# Patient Record
Sex: Female | Born: 1985
Health system: Southern US, Community
[De-identification: ages and names within clinical notes are randomized; demographics above are authoritative.]

## PROBLEM LIST (undated history)

## (undated) DIAGNOSIS — T8859XA Other complications of anesthesia, initial encounter: Secondary | ICD-10-CM

## (undated) DIAGNOSIS — R51 Headache: Secondary | ICD-10-CM

## (undated) DIAGNOSIS — T4145XA Adverse effect of unspecified anesthetic, initial encounter: Secondary | ICD-10-CM

## (undated) DIAGNOSIS — Z789 Other specified health status: Secondary | ICD-10-CM

## (undated) HISTORY — PX: DENTAL SURGERY: SHX609

## (undated) HISTORY — PX: MANDIBLE SURGERY: SHX707

## (undated) HISTORY — PX: APPENDECTOMY: SHX54

## (undated) HISTORY — PX: FOOT SURGERY: SHX648

---

## 2010-03-18 ENCOUNTER — Ambulatory Visit (HOSPITAL_COMMUNITY): Admission: RE | Admit: 2010-03-18 | Discharge: 2010-03-18 | Payer: Self-pay | Admitting: Obstetrics & Gynecology

## 2010-04-08 ENCOUNTER — Ambulatory Visit (HOSPITAL_COMMUNITY): Admission: RE | Admit: 2010-04-08 | Discharge: 2010-04-08 | Payer: Self-pay | Admitting: Obstetrics & Gynecology

## 2010-06-30 ENCOUNTER — Ambulatory Visit (HOSPITAL_COMMUNITY): Admission: RE | Admit: 2010-06-30 | Discharge: 2010-06-30 | Payer: Self-pay | Admitting: Obstetrics & Gynecology

## 2010-07-09 ENCOUNTER — Ambulatory Visit (HOSPITAL_COMMUNITY): Admission: RE | Admit: 2010-07-09 | Discharge: 2010-07-09 | Payer: Self-pay | Admitting: Obstetrics & Gynecology

## 2010-12-21 ENCOUNTER — Encounter: Payer: Self-pay | Admitting: Obstetrics & Gynecology

## 2011-07-12 LAB — ANTIBODY SCREEN: Antibody Screen: NEGATIVE

## 2011-07-12 LAB — ABO/RH: RH Type: NEGATIVE

## 2011-07-12 LAB — HEPATITIS B SURFACE ANTIGEN: Hepatitis B Surface Ag: NEGATIVE

## 2011-07-12 LAB — RPR: RPR: NONREACTIVE

## 2011-07-27 ENCOUNTER — Other Ambulatory Visit: Payer: Self-pay

## 2011-07-29 ENCOUNTER — Other Ambulatory Visit (HOSPITAL_COMMUNITY): Payer: Self-pay | Admitting: Obstetrics & Gynecology

## 2011-07-29 DIAGNOSIS — O2690 Pregnancy related conditions, unspecified, unspecified trimester: Secondary | ICD-10-CM

## 2011-08-25 ENCOUNTER — Encounter (HOSPITAL_COMMUNITY): Payer: Self-pay

## 2011-08-25 ENCOUNTER — Ambulatory Visit (HOSPITAL_COMMUNITY)
Admission: RE | Admit: 2011-08-25 | Discharge: 2011-08-25 | Disposition: A | Discharge status: Home / Self Care | Payer: 59 | Source: Ambulatory Visit | Attending: Obstetrics & Gynecology | Admitting: Obstetrics & Gynecology

## 2011-08-25 ENCOUNTER — Other Ambulatory Visit (HOSPITAL_COMMUNITY): Payer: Self-pay | Admitting: Obstetrics & Gynecology

## 2011-08-25 DIAGNOSIS — O09299 Supervision of pregnancy with other poor reproductive or obstetric history, unspecified trimester: Secondary | ICD-10-CM | POA: Insufficient documentation

## 2011-08-25 DIAGNOSIS — O2690 Pregnancy related conditions, unspecified, unspecified trimester: Secondary | ICD-10-CM

## 2011-08-25 DIAGNOSIS — O34219 Maternal care for unspecified type scar from previous cesarean delivery: Secondary | ICD-10-CM | POA: Insufficient documentation

## 2011-08-25 DIAGNOSIS — O352XX0 Maternal care for (suspected) hereditary disease in fetus, not applicable or unspecified: Secondary | ICD-10-CM | POA: Insufficient documentation

## 2011-08-25 DIAGNOSIS — Z363 Encounter for antenatal screening for malformations: Secondary | ICD-10-CM | POA: Insufficient documentation

## 2011-08-25 DIAGNOSIS — O358XX0 Maternal care for other (suspected) fetal abnormality and damage, not applicable or unspecified: Secondary | ICD-10-CM | POA: Insufficient documentation

## 2011-08-25 DIAGNOSIS — Z1389 Encounter for screening for other disorder: Secondary | ICD-10-CM | POA: Insufficient documentation

## 2011-09-14 ENCOUNTER — Other Ambulatory Visit: Payer: Self-pay

## 2011-09-21 ENCOUNTER — Ambulatory Visit (HOSPITAL_COMMUNITY)
Admission: RE | Admit: 2011-09-21 | Discharge: 2011-09-21 | Disposition: A | Discharge status: Home / Self Care | Payer: 59 | Source: Ambulatory Visit | Attending: Obstetrics & Gynecology | Admitting: Obstetrics & Gynecology

## 2011-09-21 DIAGNOSIS — O34219 Maternal care for unspecified type scar from previous cesarean delivery: Secondary | ICD-10-CM | POA: Insufficient documentation

## 2011-09-21 DIAGNOSIS — O2690 Pregnancy related conditions, unspecified, unspecified trimester: Secondary | ICD-10-CM

## 2011-09-21 DIAGNOSIS — O09299 Supervision of pregnancy with other poor reproductive or obstetric history, unspecified trimester: Secondary | ICD-10-CM | POA: Insufficient documentation

## 2011-09-21 DIAGNOSIS — O352XX0 Maternal care for (suspected) hereditary disease in fetus, not applicable or unspecified: Secondary | ICD-10-CM | POA: Insufficient documentation

## 2011-12-01 NOTE — L&D Delivery Note (Signed)
Delivery Note At 9:22 PM a viable and healthy female was delivered via VBAC, Spontaneous (Presentation: Left Occiput Anterior).  APGAR: 8, 9; weight 8 lb 6 oz (3799 g).   Placenta status: Intact, Spontaneous. Not sent Cord: 3 vessels with the following complications: None.  Cord pH: none  Anesthesia: Epidural  Episiotomy: None Lacerations: Sulcus;Vaginal Suture Repair: 3.0 chromic Est. Blood Loss (mL): 300  Mom to postpartum.  Baby to nursery-stable.  Maebell Lyvers A 02/08/2012, 10:03 PM

## 2012-01-25 LAB — STREP B DNA PROBE: GBS: NEGATIVE

## 2012-02-07 ENCOUNTER — Inpatient Hospital Stay (HOSPITAL_COMMUNITY)
Admission: AD | Admit: 2012-02-07 | Discharge: 2012-02-08 | Disposition: A | Discharge status: Home / Self Care | Payer: 59 | Source: Ambulatory Visit | Attending: Obstetrics and Gynecology | Admitting: Obstetrics and Gynecology

## 2012-02-07 ENCOUNTER — Encounter (HOSPITAL_COMMUNITY): Payer: Self-pay | Admitting: *Deleted

## 2012-02-07 DIAGNOSIS — O479 False labor, unspecified: Secondary | ICD-10-CM | POA: Insufficient documentation

## 2012-02-07 HISTORY — DX: Other specified health status: Z78.9

## 2012-02-07 NOTE — Progress Notes (Signed)
Dr. Billy Coast notified of pt presenting for labor check.  Notified of previous c/s and planning on VBAC.  Notified of VE and ctx pattern with reactive nst.  Orders to monitor for 1 hour and recheck cervix.  If no change may dc home with labor precautions.

## 2012-02-07 NOTE — Progress Notes (Signed)
Pt G2 P1, previous C/S, baby died due to a heart defect.  Having contractions every 4-6 min, passing mucous plug.  Plans for VBAC.

## 2012-02-08 ENCOUNTER — Inpatient Hospital Stay (HOSPITAL_COMMUNITY)
Admission: AD | Admit: 2012-02-08 | Discharge: 2012-02-10 | DRG: 775 | Disposition: A | Discharge status: Home / Self Care | Payer: 59 | Source: Ambulatory Visit | Attending: Obstetrics and Gynecology | Admitting: Obstetrics and Gynecology

## 2012-02-08 ENCOUNTER — Encounter (HOSPITAL_COMMUNITY): Payer: Self-pay | Admitting: *Deleted

## 2012-02-08 ENCOUNTER — Encounter (HOSPITAL_COMMUNITY): Payer: Self-pay | Admitting: Anesthesiology

## 2012-02-08 ENCOUNTER — Inpatient Hospital Stay (HOSPITAL_COMMUNITY): Payer: 59 | Admitting: Anesthesiology

## 2012-02-08 DIAGNOSIS — Z22330 Carrier of Group B streptococcus: Secondary | ICD-10-CM

## 2012-02-08 DIAGNOSIS — O480 Post-term pregnancy: Principal | ICD-10-CM | POA: Diagnosis present

## 2012-02-08 DIAGNOSIS — O34219 Maternal care for unspecified type scar from previous cesarean delivery: Secondary | ICD-10-CM | POA: Diagnosis present

## 2012-02-08 DIAGNOSIS — O99892 Other specified diseases and conditions complicating childbirth: Secondary | ICD-10-CM | POA: Diagnosis present

## 2012-02-08 DIAGNOSIS — IMO0001 Reserved for inherently not codable concepts without codable children: Secondary | ICD-10-CM

## 2012-02-08 LAB — CBC
HCT: 36.1 % (ref 36.0–46.0)
MCH: 32.6 pg (ref 26.0–34.0)
MCHC: 34.1 g/dL (ref 30.0–36.0)
MCV: 95.8 fL (ref 78.0–100.0)
Platelets: 177 10*3/uL (ref 150–400)
RDW: 14.9 % (ref 11.5–15.5)

## 2012-02-08 LAB — ABO/RH: ABO/RH(D): O NEG

## 2012-02-08 MED ORDER — OXYCODONE-ACETAMINOPHEN 5-325 MG PO TABS: 1.0000 | ORAL_TABLET | ORAL | Status: DC | PRN

## 2012-02-08 MED ORDER — SENNOSIDES-DOCUSATE SODIUM 8.6-50 MG PO TABS
2.0000 | ORAL_TABLET | Freq: Every day | ORAL | Status: DC
  Administered 2012-02-09: 2 via ORAL

## 2012-02-08 MED ORDER — LACTATED RINGERS IV SOLN: INTRAVENOUS | Status: DC

## 2012-02-08 MED ORDER — TERBUTALINE SULFATE 1 MG/ML IJ SOLN: 0.2500 mg | Freq: Once | INTRAMUSCULAR | Status: DC | PRN

## 2012-02-08 MED ORDER — OXYTOCIN 20 UNITS IN LACTATED RINGERS INFUSION - SIMPLE: 1.0000 m[IU]/min | INTRAVENOUS | Status: DC

## 2012-02-08 MED ORDER — PHENYLEPHRINE 40 MCG/ML (10ML) SYRINGE FOR IV PUSH (FOR BLOOD PRESSURE SUPPORT): 80.0000 ug | PREFILLED_SYRINGE | INTRAVENOUS | Status: DC | PRN

## 2012-02-08 MED ORDER — WITCH HAZEL-GLYCERIN EX PADS: 1.0000 "application " | MEDICATED_PAD | CUTANEOUS | Status: DC | PRN

## 2012-02-08 MED ORDER — EPHEDRINE 5 MG/ML INJ
10.0000 mg | INTRAVENOUS | Status: DC | PRN
  Filled 2012-02-08: qty 4

## 2012-02-08 MED ORDER — TETANUS-DIPHTH-ACELL PERTUSSIS 5-2.5-18.5 LF-MCG/0.5 IM SUSP
0.5000 mL | Freq: Once | INTRAMUSCULAR | Status: AC
  Administered 2012-02-09: 0.5 mL via INTRAMUSCULAR
  Filled 2012-02-08: qty 0.5

## 2012-02-08 MED ORDER — IBUPROFEN 600 MG PO TABS
600.0000 mg | ORAL_TABLET | Freq: Four times a day (QID) | ORAL | Status: DC
  Administered 2012-02-09 – 2012-02-10 (×5): 600 mg via ORAL
  Filled 2012-02-08 (×4): qty 1

## 2012-02-08 MED ORDER — SIMETHICONE 80 MG PO CHEW: 80.0000 mg | CHEWABLE_TABLET | ORAL | Status: DC | PRN

## 2012-02-08 MED ORDER — FERROUS SULFATE 325 (65 FE) MG PO TABS
325.0000 mg | ORAL_TABLET | Freq: Two times a day (BID) | ORAL | Status: DC
  Administered 2012-02-09 – 2012-02-10 (×3): 325 mg via ORAL
  Filled 2012-02-08 (×3): qty 1

## 2012-02-08 MED ORDER — LACTATED RINGERS IV SOLN: 500.0000 mL | INTRAVENOUS | Status: DC | PRN

## 2012-02-08 MED ORDER — DIPHENHYDRAMINE HCL 50 MG/ML IJ SOLN: 12.5000 mg | INTRAMUSCULAR | Status: DC | PRN

## 2012-02-08 MED ORDER — CITRIC ACID-SODIUM CITRATE 334-500 MG/5ML PO SOLN: 30.0000 mL | ORAL | Status: DC | PRN

## 2012-02-08 MED ORDER — OXYTOCIN BOLUS FROM INFUSION
500.0000 mL | Freq: Once | INTRAVENOUS | Status: DC
  Filled 2012-02-08: qty 500
  Filled 2012-02-08: qty 1000

## 2012-02-08 MED ORDER — ZOLPIDEM TARTRATE 5 MG PO TABS: 5.0000 mg | ORAL_TABLET | Freq: Every evening | ORAL | Status: DC | PRN

## 2012-02-08 MED ORDER — IBUPROFEN 600 MG PO TABS
600.0000 mg | ORAL_TABLET | Freq: Four times a day (QID) | ORAL | Status: DC | PRN
  Administered 2012-02-08: 600 mg via ORAL
  Filled 2012-02-08: qty 1

## 2012-02-08 MED ORDER — LIDOCAINE HCL (PF) 1 % IJ SOLN
INTRAMUSCULAR | Status: DC | PRN
  Administered 2012-02-08 (×2): 5 mL

## 2012-02-08 MED ORDER — DIBUCAINE 1 % RE OINT: 1.0000 "application " | TOPICAL_OINTMENT | RECTAL | Status: DC | PRN

## 2012-02-08 MED ORDER — ONDANSETRON HCL 4 MG/2ML IJ SOLN: 4.0000 mg | Freq: Four times a day (QID) | INTRAMUSCULAR | Status: DC | PRN

## 2012-02-08 MED ORDER — LIDOCAINE HCL (PF) 1 % IJ SOLN
30.0000 mL | INTRAMUSCULAR | Status: DC | PRN
  Filled 2012-02-08: qty 30

## 2012-02-08 MED ORDER — LANOLIN HYDROUS EX OINT: TOPICAL_OINTMENT | CUTANEOUS | Status: DC | PRN

## 2012-02-08 MED ORDER — EPHEDRINE 5 MG/ML INJ: 10.0000 mg | INTRAVENOUS | Status: DC | PRN

## 2012-02-08 MED ORDER — ONDANSETRON HCL 4 MG PO TABS: 4.0000 mg | ORAL_TABLET | ORAL | Status: DC | PRN

## 2012-02-08 MED ORDER — PRENATAL MULTIVITAMIN CH
1.0000 | ORAL_TABLET | Freq: Every day | ORAL | Status: DC
  Administered 2012-02-09 – 2012-02-10 (×2): 1 via ORAL
  Filled 2012-02-08 (×2): qty 1

## 2012-02-08 MED ORDER — PHENYLEPHRINE 40 MCG/ML (10ML) SYRINGE FOR IV PUSH (FOR BLOOD PRESSURE SUPPORT)
80.0000 ug | PREFILLED_SYRINGE | INTRAVENOUS | Status: DC | PRN
  Filled 2012-02-08: qty 5

## 2012-02-08 MED ORDER — FENTANYL 2.5 MCG/ML BUPIVACAINE 1/10 % EPIDURAL INFUSION (WH - ANES)
14.0000 mL/h | INTRAMUSCULAR | Status: DC
  Administered 2012-02-08 (×2): 14 mL/h via EPIDURAL
  Filled 2012-02-08 (×2): qty 60

## 2012-02-08 MED ORDER — FLEET ENEMA 7-19 GM/118ML RE ENEM: 1.0000 | ENEMA | RECTAL | Status: DC | PRN

## 2012-02-08 MED ORDER — LACTATED RINGERS IV SOLN: 500.0000 mL | Freq: Once | INTRAVENOUS | Status: DC

## 2012-02-08 MED ORDER — ACETAMINOPHEN 325 MG PO TABS: 650.0000 mg | ORAL_TABLET | ORAL | Status: DC | PRN

## 2012-02-08 MED ORDER — DIPHENHYDRAMINE HCL 25 MG PO CAPS: 25.0000 mg | ORAL_CAPSULE | Freq: Four times a day (QID) | ORAL | Status: DC | PRN

## 2012-02-08 MED ORDER — BUPIVACAINE HCL (PF) 0.25 % IJ SOLN
INTRAMUSCULAR | Status: DC | PRN
  Administered 2012-02-08 (×2): 5 mL

## 2012-02-08 MED ORDER — OXYTOCIN 20 UNITS IN LACTATED RINGERS INFUSION - SIMPLE
125.0000 mL/h | Freq: Once | INTRAVENOUS | Status: AC
  Administered 2012-02-08: 500 mL/h via INTRAVENOUS

## 2012-02-08 MED ORDER — BENZOCAINE-MENTHOL 20-0.5 % EX AERO: 1.0000 "application " | INHALATION_SPRAY | CUTANEOUS | Status: DC | PRN

## 2012-02-08 MED ORDER — ONDANSETRON HCL 4 MG/2ML IJ SOLN: 4.0000 mg | INTRAMUSCULAR | Status: DC | PRN

## 2012-02-08 NOTE — Progress Notes (Signed)
Holly Boyd is a 26 y.o. G2P1000 at [redacted]w[redacted]d by LMP admitted for active labor. Comfortable post epidural  Subjective: No chief complaint on file.   Objective: BP 123/83  Pulse 92  Temp(Src) 98.2 F (36.8 C) (Oral)  Resp 20  SpO2 98%  LMP 05/03/2011      FHT:  FHR: 140 bpm, variability: moderate,  accelerations:  Present,  decelerations:  Absent UC:   irregular, every 1 1/2-3 minutes SVE:   5/80/-1 Arom clear fluid  IUPC Placed asynclytic  Labs: Lab Results  Component Value Date   WBC 16.4* 02/08/2012   HGB 12.3 02/08/2012   HCT 36.1 02/08/2012   MCV 95.8 02/08/2012   PLT 177 02/08/2012    Assessment / Plan: active labor Prev c/s Postdates P) pitocin augmentation prn.   exaggerated left sims   Anticipated MOD:  unsure  Richie Bonanno A 02/08/2012, 6:05 PM

## 2012-02-08 NOTE — Progress Notes (Signed)
Pt transported to room 114 via wheelchair at 2300, support person present, report given to mb nurse

## 2012-02-08 NOTE — H&P (Signed)
Holly Boyd is a 26 y.o. female G2P1100 MWF hx LTCS now @ 40 4/[redacted] wk gestation present in active labor. Pt desires attempted vaginal birth. PNC uncomplicated. Prev neonatal death due to cardiac anomalies. Exam in office 4/80/-1 bulging membrane History OB History    Grav Para Term Preterm Abortions TAB SAB Ect Mult Living   2 1 1  0 0 0 0 0 0 0     Past Medical History  Diagnosis Date  . No pertinent past medical history    Past Surgical History  Procedure Date  . Cesarean section   . Appendectomy   . Foot surgery   . Dental surgery   . Mandible surgery    Family History: family history is not on file. Social History:  reports that she has never smoked. She does not have any smokeless tobacco history on file. She reports that she does not drink alcohol or use illicit drugs.  ROS neg    Blood pressure 135/88, pulse 96, last menstrual period 05/03/2011. Maternal Exam:  Uterine Assessment: Contraction strength is moderate.  Contraction frequency is irregular.   Abdomen: Surgical scars: low transverse.   Fundal height is term.   Estimated fetal weight is 8 1/2 lb.   Fetal presentation: vertex  Introitus: Ferning test: not done.  Nitrazine test: not done. Amniotic fluid character: not assessed.  Cervix: Cervix evaluated by digital exam.     Physical Exam  Constitutional: She is oriented to person, place, and time. She appears well-developed and well-nourished.       obese  HENT:  Head: Normocephalic.  Eyes: EOM are normal.  Neck: Neck supple.  Cardiovascular: Regular rhythm.   Respiratory: Breath sounds normal.  GI: Soft.       gravid  Musculoskeletal: She exhibits no edema.  Neurological: She is alert and oriented to person, place, and time.  Skin: Skin is warm and dry.  Psychiatric: She has a normal mood and affect.   Tracing; baseline 140   Ctx q 1 1/2 to 3 mins Prenatal labs: ABO, Rh:  O neg Antibody:  neg Rubella:  immune RPR:   nr HBsAg:   neg HIV:    neg GBS:   neg   Assessment/Plan: Active labor Previous C/S Postdates  P) admit routine labs. Reconfirm desire for AVBAC( pt and husband expresses desire). Epidural. Low dose pitocin amniotomy prn. Rhophylac if indicated  Holly Boyd A 02/08/2012, 4:25 PM

## 2012-02-08 NOTE — Anesthesia Preprocedure Evaluation (Signed)
Anesthesia Evaluation  Patient identified by MRN, date of birth, ID band Patient awake    Reviewed: Allergy & Precautions, H&P , Patient's Chart, lab work & pertinent test results  Airway Mallampati: II TM Distance: >3 FB Neck ROM: full    Dental No notable dental hx.    Pulmonary neg pulmonary ROS,  breath sounds clear to auscultation  Pulmonary exam normal       Cardiovascular negative cardio ROS  Rhythm:regular Rate:Normal     Neuro/Psych negative neurological ROS  negative psych ROS   GI/Hepatic negative GI ROS, Neg liver ROS,   Endo/Other  negative endocrine ROSMorbid obesity  Renal/GU negative Renal ROS     Musculoskeletal   Abdominal   Peds  Hematology negative hematology ROS (+)   Anesthesia Other Findings   Reproductive/Obstetrics (+) Pregnancy                           Anesthesia Physical Anesthesia Plan  ASA: III  Anesthesia Plan: Epidural   Post-op Pain Management:    Induction:   Airway Management Planned:   Additional Equipment:   Intra-op Plan:   Post-operative Plan:   Informed Consent: I have reviewed the patients History and Physical, chart, labs and discussed the procedure including the risks, benefits and alternatives for the proposed anesthesia with the patient or authorized representative who has indicated his/her understanding and acceptance.     Plan Discussed with:   Anesthesia Plan Comments:         Anesthesia Quick Evaluation  

## 2012-02-08 NOTE — ED Provider Notes (Signed)
History   Presents for Labor evaluation  Chief Complaint  Patient presents with  . Labor Eval   HPI  OB History    Grav Para Term Preterm Abortions TAB SAB Ect Mult Living   2 1 1  0 0 0 0 0 0 0      Past Medical History  Diagnosis Date  . No pertinent past medical history     Past Surgical History  Procedure Date  . Cesarean section   . Appendectomy   . Foot surgery   . Dental surgery   . Mandible surgery     History reviewed. No pertinent family history.  History  Substance Use Topics  . Smoking status: Never Smoker   . Smokeless tobacco: Not on file  . Alcohol Use: No    Allergies:  Allergies  Allergen Reactions  . Morphine And Related Hives and Itching  . Penicillins Hives and Itching    No prescriptions prior to admission    ROS Physical Exam NST review   Blood pressure 118/73, pulse 103, temperature 97.9 F (36.6 C), temperature source Oral, resp. rate 20, height 5\' 3"  (1.6 m), weight 111.222 kg (245 lb 3.2 oz), last menstrual period 05/03/2011.  Physical Exam  MAU Course  Procedures  MDM na  Assessment and Plan  Prodromal labor Reactive NsT DC home Labor warnings  Rameen Gohlke J 02/08/2012, 9:20 AM

## 2012-02-08 NOTE — Anesthesia Procedure Notes (Signed)
Epidural Patient location during procedure: OB Start time: 02/08/2012 4:42 PM  Staffing Anesthesiologist: Brayton Caves R Performed by: anesthesiologist   Preanesthetic Checklist Completed: patient identified, site marked, surgical consent, pre-op evaluation, timeout performed, IV checked, risks and benefits discussed and monitors and equipment checked  Epidural Patient position: sitting Prep: site prepped and draped and DuraPrep Patient monitoring: continuous pulse ox and blood pressure Approach: midline Injection technique: LOR air and LOR saline  Needle:  Needle type: Tuohy  Needle gauge: 17 G Needle length: 9 cm Needle insertion depth: 7 cm Catheter type: closed end flexible Catheter size: 19 Gauge Catheter at skin depth: 12 cm Test dose: negative  Assessment Events: blood not aspirated, injection not painful, no injection resistance, negative IV test and no paresthesia  Additional Notes Patient identified.  Risk benefits discussed including failed block, incomplete pain control, headache, nerve damage, paralysis, blood pressure changes, nausea, vomiting, reactions to medication both toxic or allergic, and postpartum back pain.  Patient expressed understanding and wished to proceed.  All questions were answered.  Sterile technique used throughout procedure and epidural site dressed with sterile barrier dressing. No paresthesia or other complications noted.The patient did not experience any signs of intravascular injection such as tinnitus or metallic taste in mouth nor signs of intrathecal spread such as rapid motor block. Please see nursing notes for vital signs.

## 2012-02-09 ENCOUNTER — Encounter (HOSPITAL_COMMUNITY): Payer: Self-pay

## 2012-02-09 LAB — CBC
HCT: 33 % — ABNORMAL LOW (ref 36.0–46.0)
Hemoglobin: 11 g/dL — ABNORMAL LOW (ref 12.0–15.0)
RBC: 3.4 MIL/uL — ABNORMAL LOW (ref 3.87–5.11)

## 2012-02-09 MED ORDER — BENZOCAINE-MENTHOL 20-0.5 % EX AERO
INHALATION_SPRAY | CUTANEOUS | Status: AC
  Filled 2012-02-09: qty 56

## 2012-02-09 NOTE — Progress Notes (Signed)
PPD 1 VBAC  S:  Reports feeling well.             Tolerating po/ No nausea or vomiting             Bleeding is moderate             Pain controlled with Motrin             Up ad lib / ambulatory  Newborn  Information for the patient's newborn:  Asleson, Girl Noriah [[id number]]  female  breast feeding  / difficulty latching, "pinched" nipple; assisted w/ BFing and latch   O:  A & O x 3 NAD             VS: Blood pressure 127/82, pulse 98, temperature 98.3 F (36.8 C), temperature source Oral, resp. rate 20, weight 111.131 kg (245 lb), last menstrual period 05/03/2011, SpO2 97.00%, unknown if currently breastfeeding.  LABS:  Basename 02/09/12 0537 02/08/12 1550  WBC 19.4* 16.4*  HGB 11.0* 12.3  HCT 33.0* 36.1  PLT 188 177    I&O:        Lungs: Clear and unlabored  Heart: regular rate and rhythm / no mumurs  Abdomen: soft, non-tender, non-distended, obese             Fundus: firm, non-tender, @U   Perineum: intact, no edema  Lochia: small  Extremities: no edema, no calf pain or tenderness, neg Homans    A/P: PPD # 1 26 y.o., G2P2001    Principal Problem:  *PP care - s/p VBAC 3/11   Doing well - stable status  Routine post partum orders  Lactation assist today for latch  Anticipate discharge home in AM.   Westin Knotts, CNM, MSN 02/09/2012, 10:06 AM

## 2012-02-09 NOTE — Anesthesia Postprocedure Evaluation (Signed)
  Anesthesia Post-op Note  Patient: Holly Boyd  Procedure(s) Performed: * No procedures listed *  Patient Location: Mother/Baby  Anesthesia Type: Epidural  Level of Consciousness: awake  Airway and Oxygen Therapy: Patient Spontanous Breathing  Post-op Pain: none  Post-op Assessment: Patient's Cardiovascular Status Stable, Respiratory Function Stable and No signs of Nausea or vomiting  Post-op Vital Signs: Reviewed and stable  Complications: No apparent anesthesia complications

## 2012-02-10 MED ORDER — IBUPROFEN 600 MG PO TABS: 600.0000 mg | ORAL_TABLET | Freq: Four times a day (QID) | ORAL | Status: AC

## 2012-02-10 NOTE — Discharge Summary (Signed)
Obstetric Discharge Summary Reason for Admission: onset of labor Prenatal Procedures: none Intrapartum Procedures: spontaneous vaginal delivery and successful VBAC Postpartum Procedures: none Complications-Operative and Postpartum: vaginal laceration Hemoglobin  Date Value Range Status  02/09/2012 11.0* 12.0-15.0 (g/dL) Final     HCT  Date Value Range Status  02/09/2012 33.0* 36.0-46.0 (%) Final    Discharge Diagnoses: Term Pregnancy-delivered  Discharge Information: Date: 02/10/2012 Activity: pelvic rest Diet: routine Medications: PNV, Ibuprofen and Calcium 500-1000mg  with magnesium 400 mg (leg cramps) Condition: stable Instructions: refer to practice specific booklet Discharge to: home Follow-up Information    Follow up with LAVOIE,MARIE-LYNE, MD. Schedule an appointment as soon as possible for a visit in 6 weeks.   Contact information:   [address] [place] [address] [phone]          Newborn Data: Live born female  Birth Weight: 8 lb 6 oz (3799 g) APGAR: 8, 9  Home with mother.  Marlinda Mike 02/10/2012, 10:57 AM

## 2012-02-10 NOTE — Discharge Instructions (Signed)
Leg cramps: calcium 500-1000mg  ( TUMS 1-2 tabs)  AND Magnesium 400 mg nightly x 1 week  Push water intake - limit salt Call if swelling is not resolved in 1 week

## 2012-02-10 NOTE — Progress Notes (Signed)
Patient ID: Learta Codding, female   DOB: 1986-08-19, 26 y.o.   MRN: [id number]  PPD 2 SVD  S:  Reports feeling well - ready for discharge             Tolerating po/ No nausea or vomiting             Bleeding is light             Pain controlled with motrin only             Up ad lib / ambulatory  Newborn breast feeding     O:  A & O x 3              VS: Blood pressure 102/69, pulse 86, temperature 98.2 F (36.8 C), temperature source Oral, resp. rate 18, weight 111.131 kg (245 lb), last menstrual period 05/03/2011, SpO2 97.00%, unknown if currently breastfeeding.  Lungs: Clear and unlabored  Heart: regular rate and rhythm / no mumurs  Abdomen: soft, non-tender, non-distended              Fundus: firm, non-tender, U-1  Perineum: mild edema  Lochia: light  Extremities: 1+ edema, no calf pain or tenderness    A: PPD # 2 SVD             Dependent edema  Doing well - stable status  P:  Routine post partum orders  Discharge home  Marlinda Mike 02/10/2012, 10:53 AM

## 2012-02-22 IMAGING — US US OB DETAIL+14 WK
1 series · 18 of 28 positions shown · non-contrast
Comparison: none

OBSTETRICAL ULTRASOUND:
 This ultrasound was performed in The [HOSPITAL], and the [facility] report will be stored to [REDACTED] PACS.  This report is also available in [HOSPITAL]?s accessANYware.

[Series 1: us ob detail+14 wk · 18 of 47 slices shown]
[im 1/47]
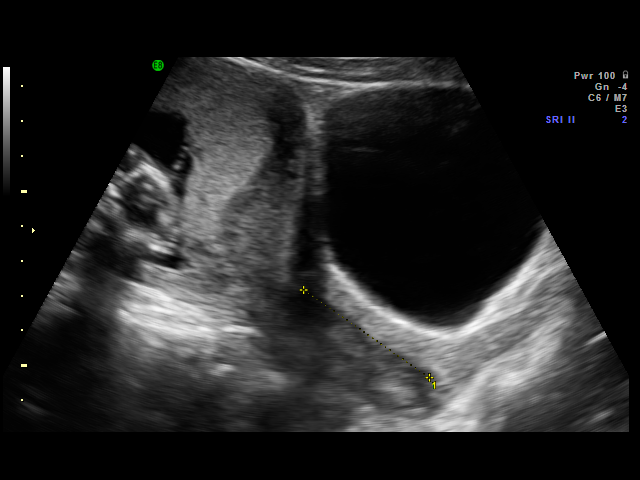
[im 4/47]
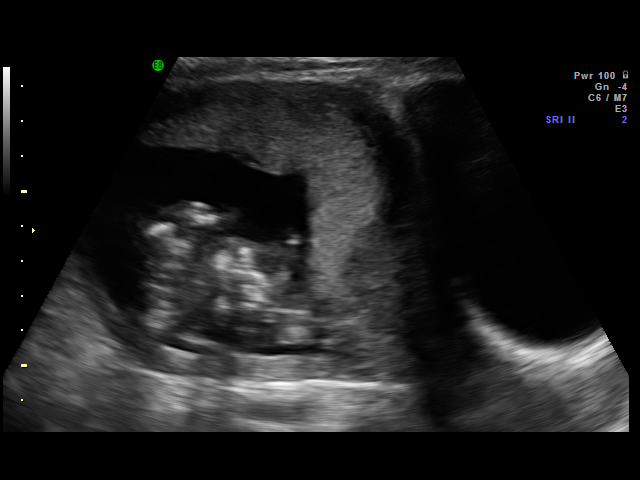
[im 6/47]
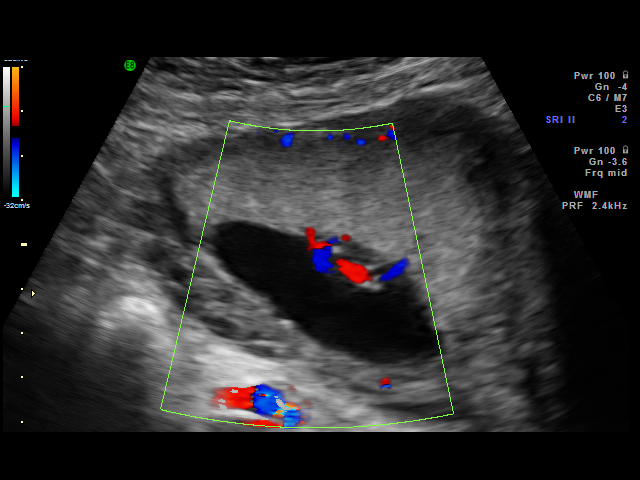
[im 9/47]
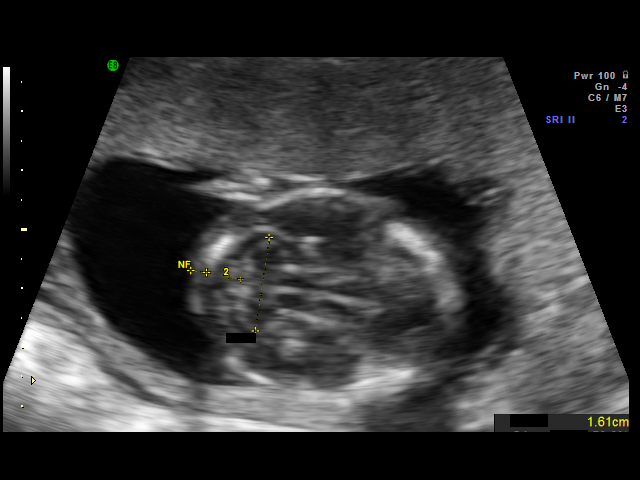
[im 12/47]
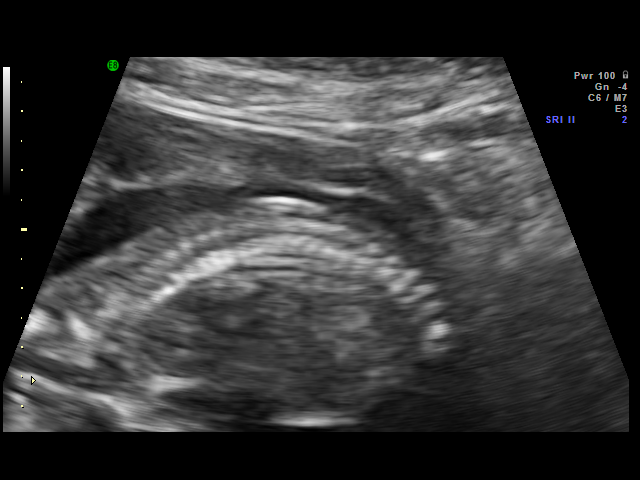
[im 14/47]
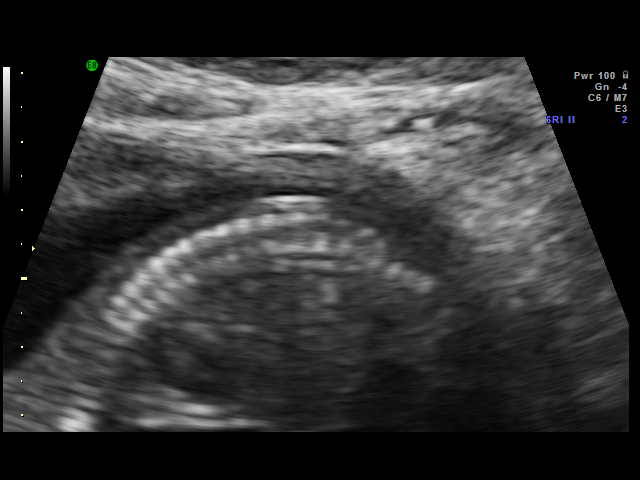
[im 18/47]
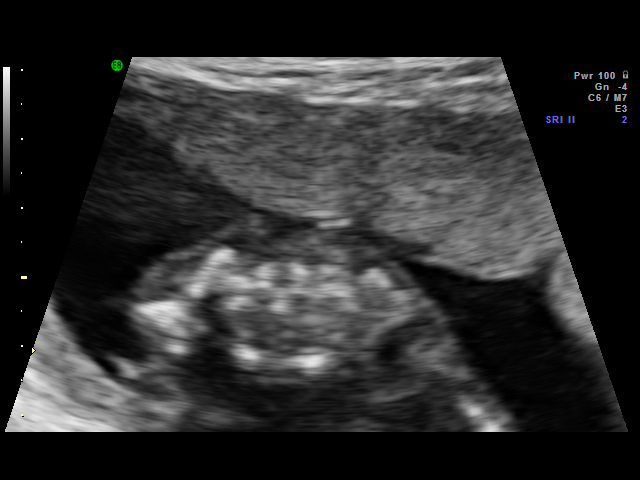
[im 19/47]
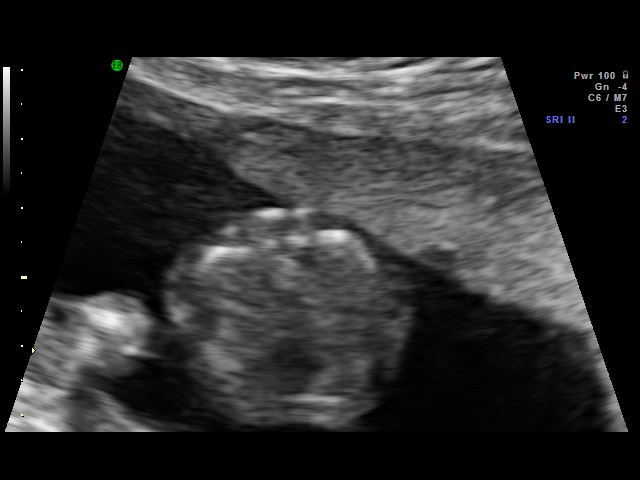
[im 23/47]
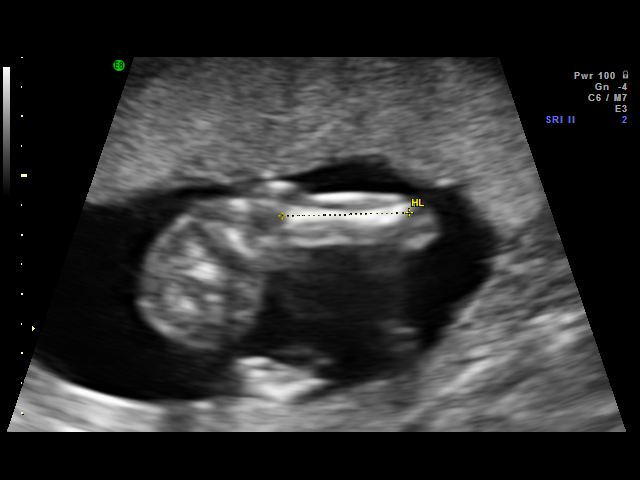
[im 24/47]
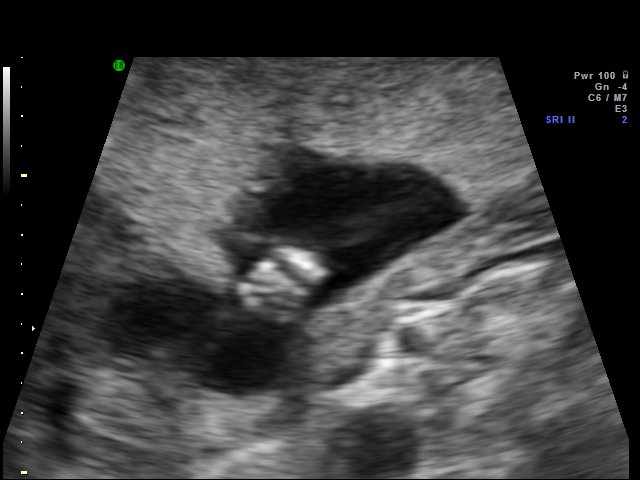
[im 28/47]
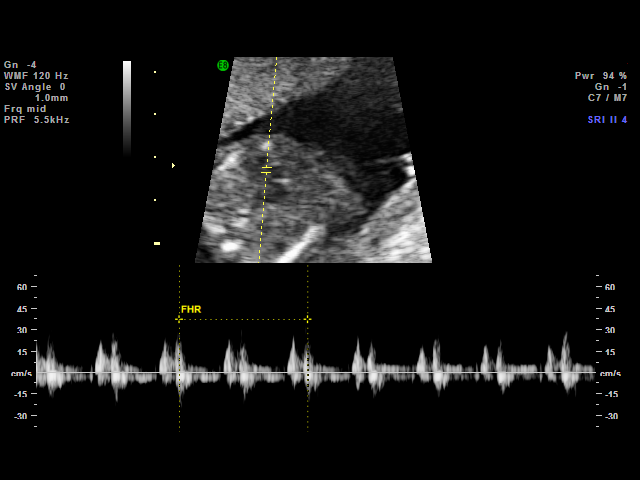
[im 29/47]
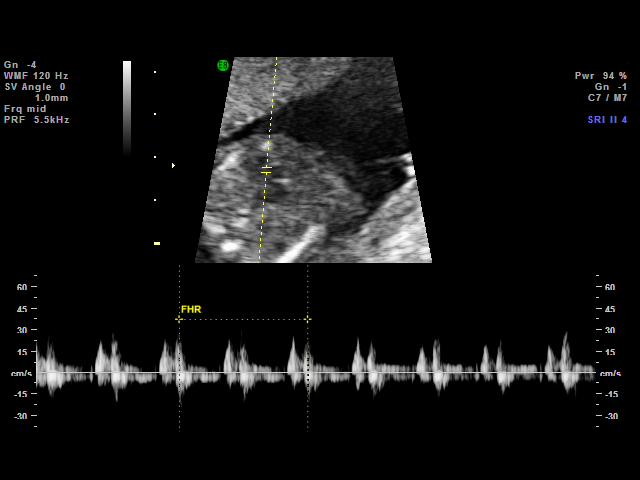
[im 33/47]
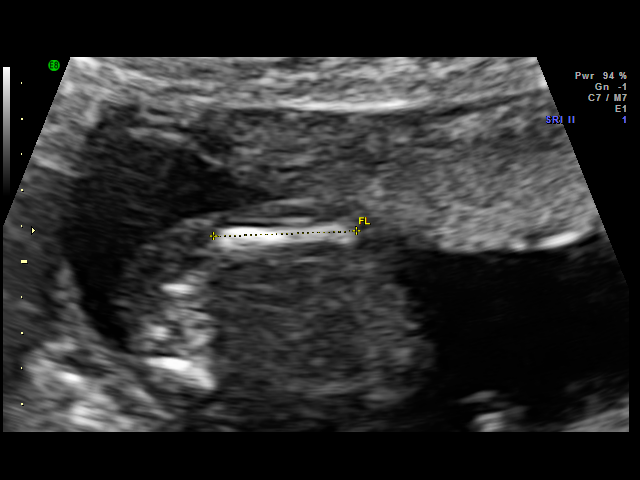
[im 36/47]
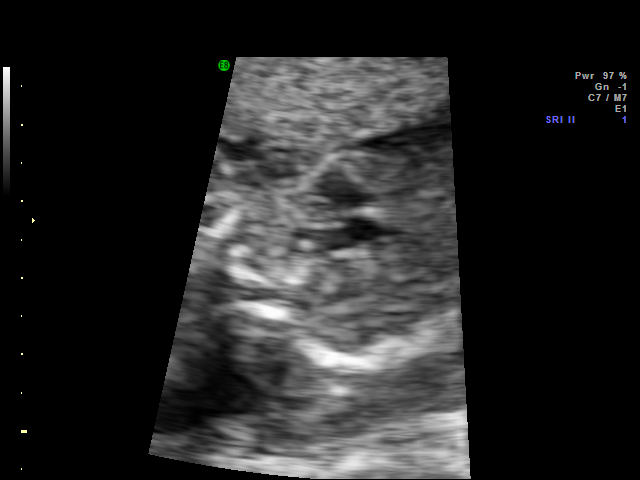
[im 38/47]
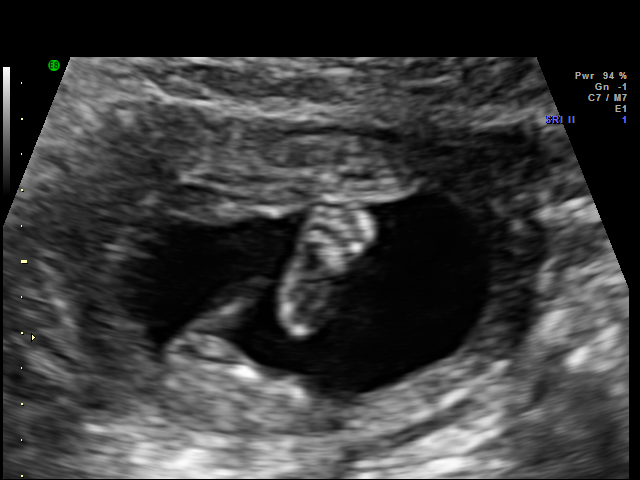
[im 41/47]
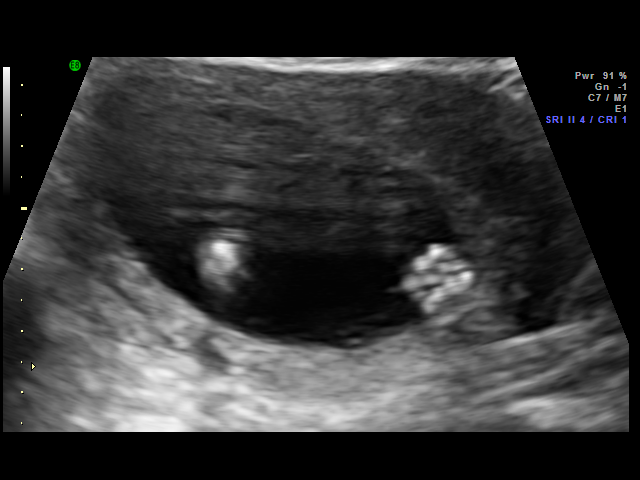
[im 43/47]
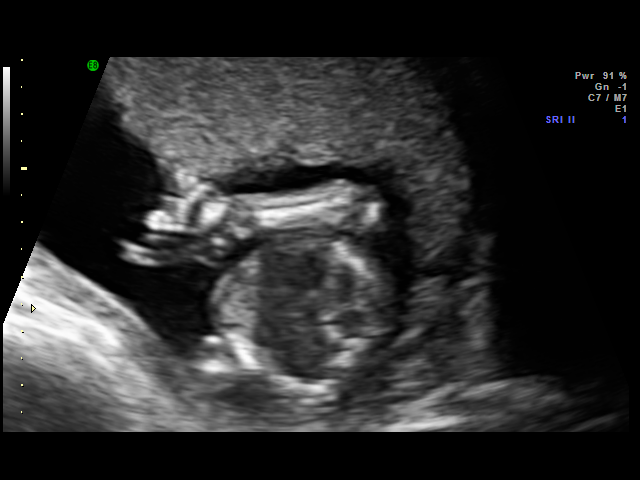
[im 47/47]
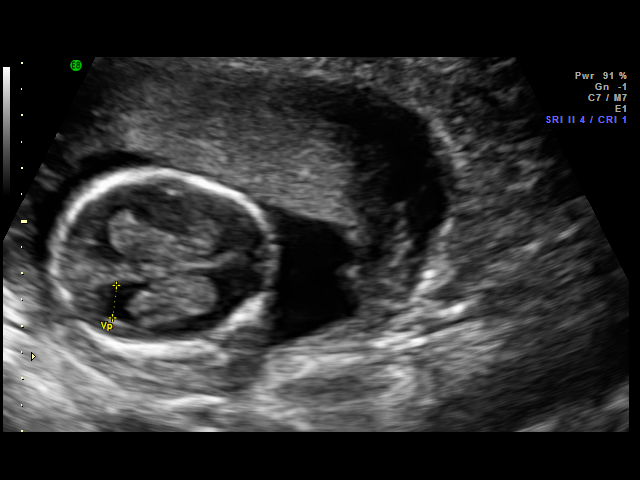

[18 of 28 positions shown; findings below may reference images not displayed]

IMPRESSION: [facility] has also been faxed to the ordering physician.

## 2013-07-31 IMAGING — US US OB DETAIL+14 WK
1 series · 14 of 28 positions shown · non-contrast
Comparison: none

[Series 1: us ob detail+14 wk · 14 of 108 slices shown]
[im 4/108]
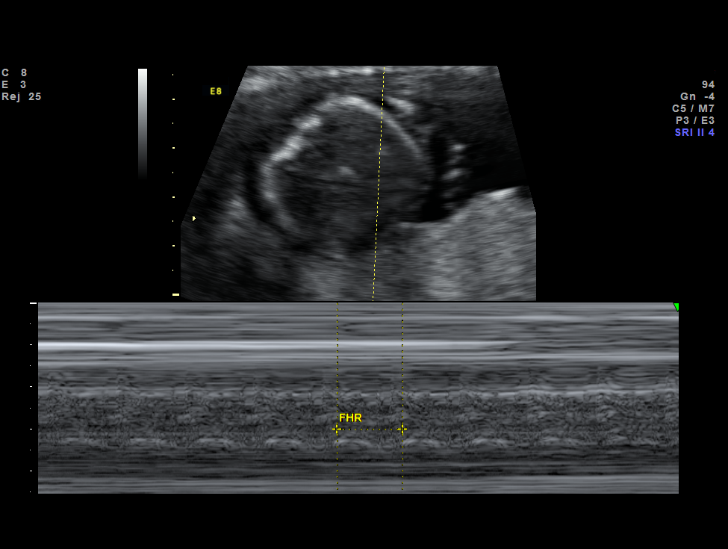
[im 12/108]
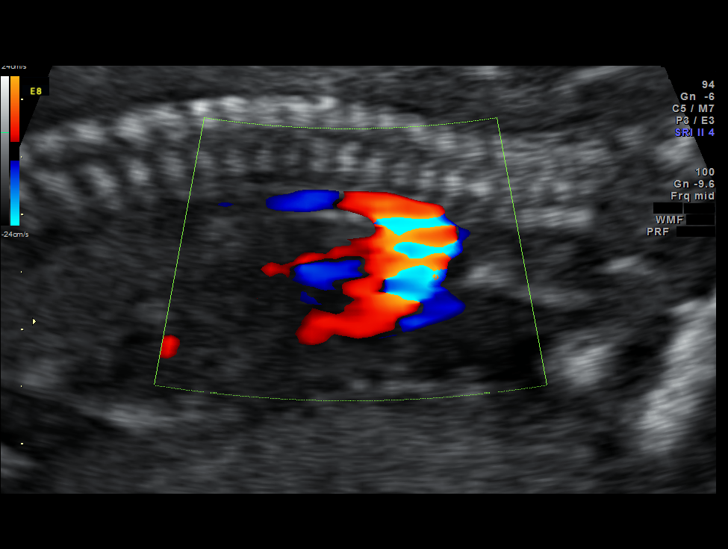
[im 20/108]
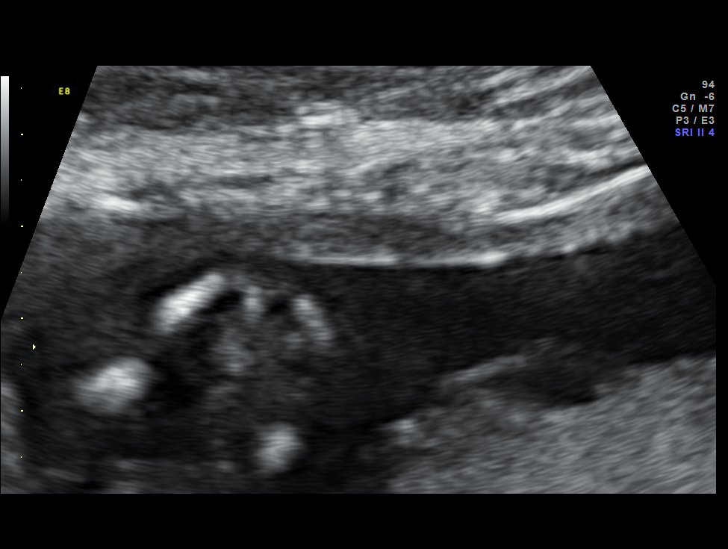
[im 28/108]
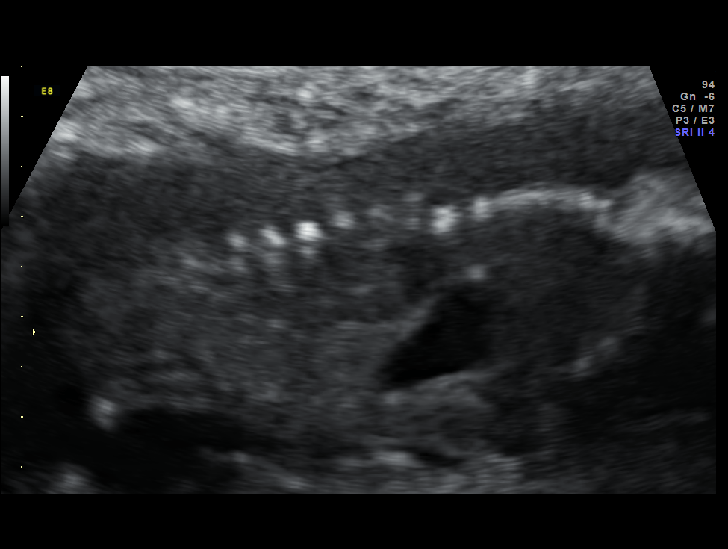
[im 36/108]
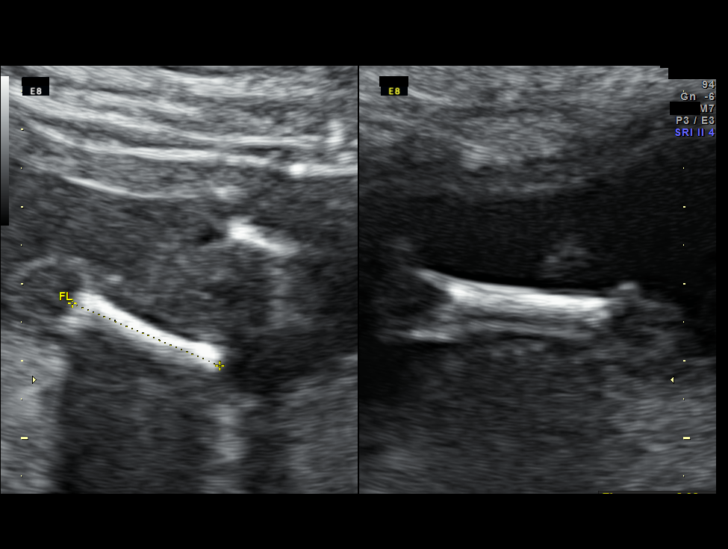
[im 44/108]
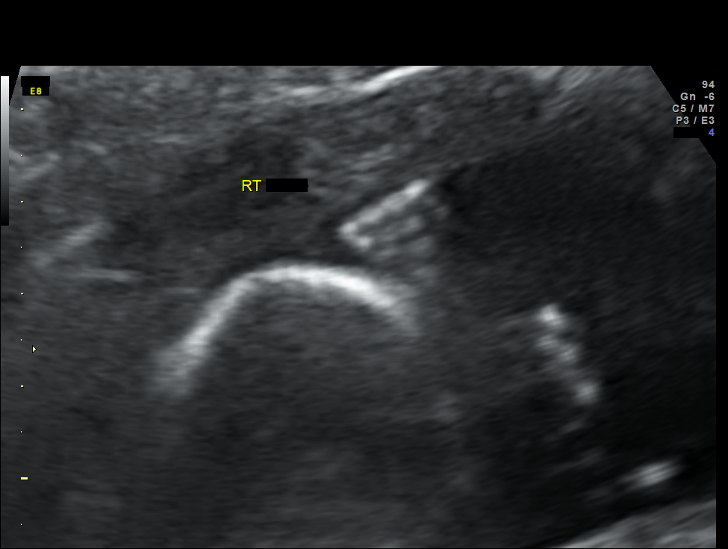
[im 52/108]
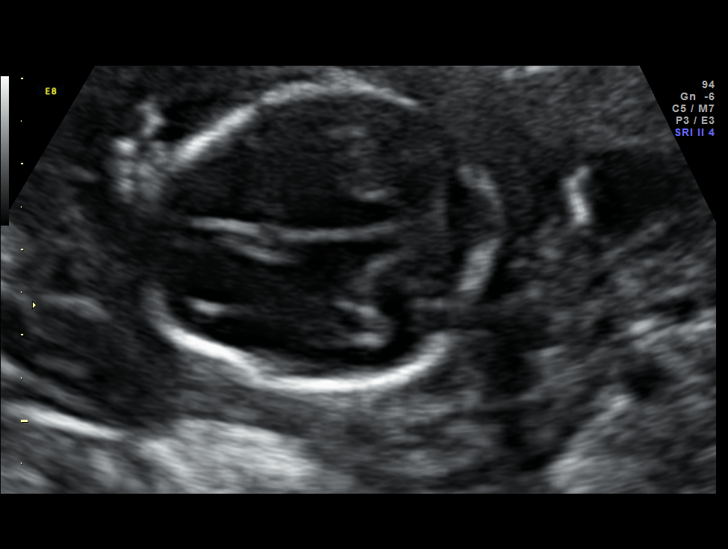
[im 60/108]
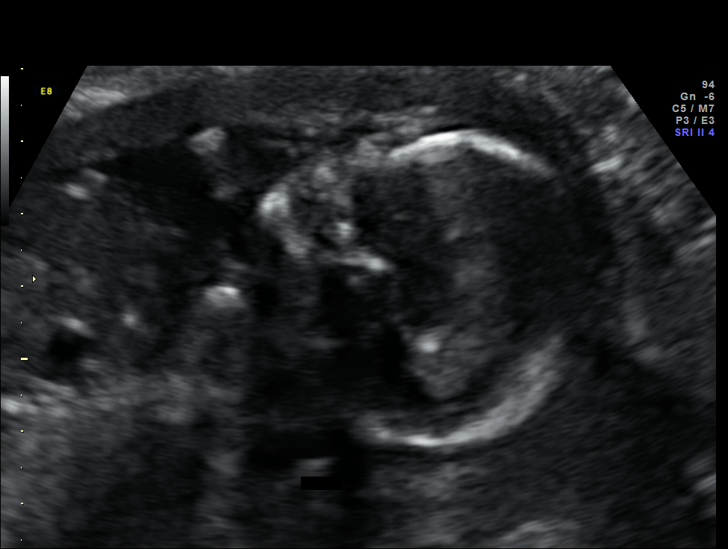
[im 68/108]
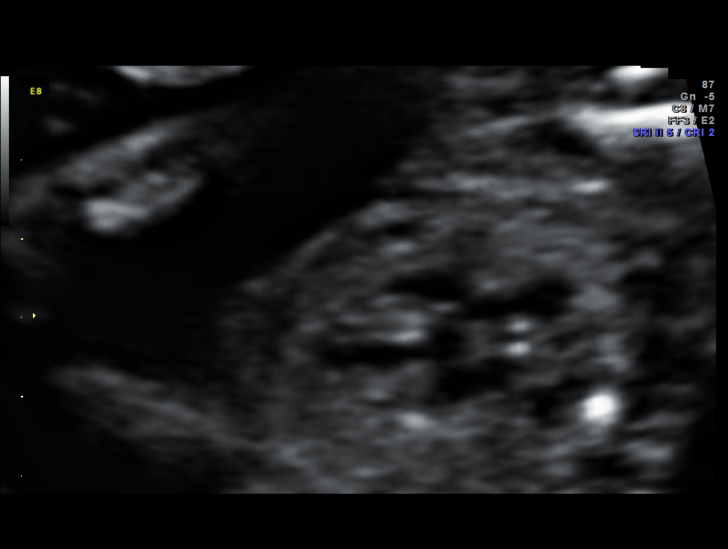
[im 76/108]
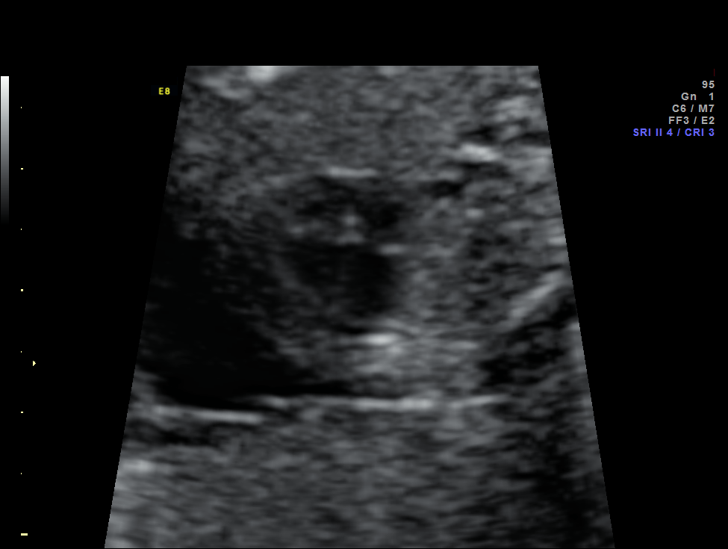
[im 84/108]
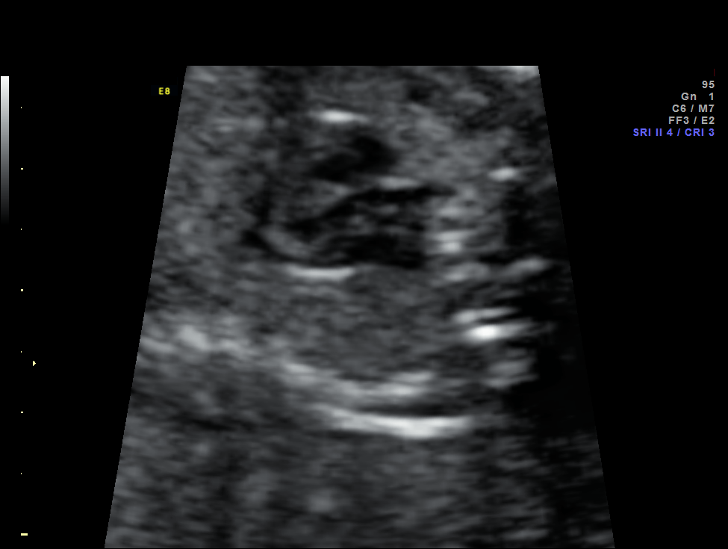
[im 92/108]
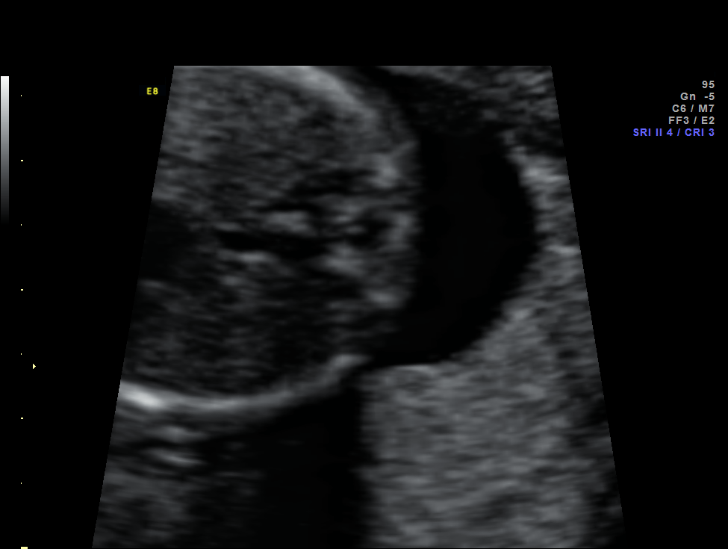
[im 100/108]
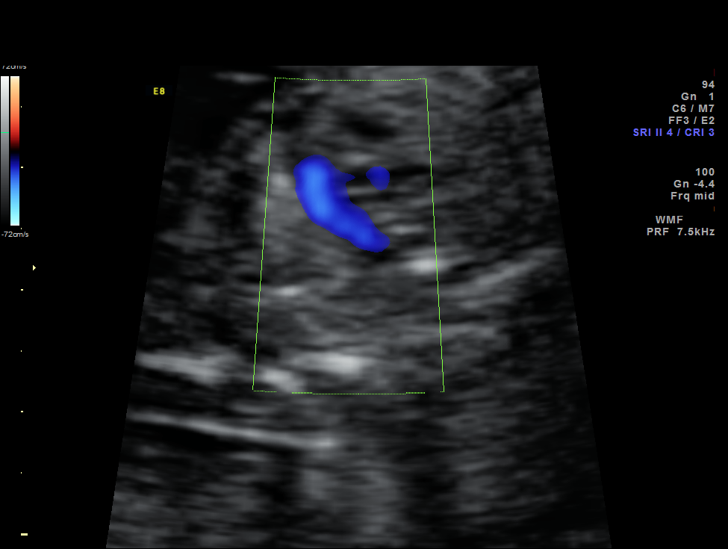
[im 108/108]
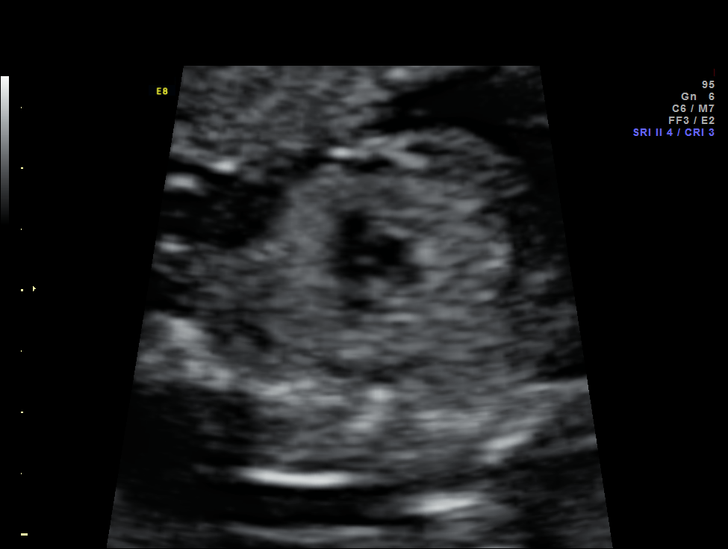

[14 of 28 positions shown; findings below may reference images not displayed]

Canned report from images found in remote index.

Refer to host system for actual result text.

## 2013-08-16 LAB — OB RESULTS CONSOLE RPR: RPR: NONREACTIVE

## 2013-08-16 LAB — OB RESULTS CONSOLE HEPATITIS B SURFACE ANTIGEN: Hepatitis B Surface Ag: NEGATIVE

## 2013-08-16 LAB — OB RESULTS CONSOLE ANTIBODY SCREEN: ANTIBODY SCREEN: NEGATIVE

## 2013-08-16 LAB — OB RESULTS CONSOLE HIV ANTIBODY (ROUTINE TESTING): HIV: NONREACTIVE

## 2013-08-16 LAB — OB RESULTS CONSOLE ABO/RH: RH Type: NEGATIVE

## 2013-08-16 LAB — OB RESULTS CONSOLE RUBELLA ANTIBODY, IGM: Rubella: IMMUNE

## 2013-08-23 LAB — OB RESULTS CONSOLE GC/CHLAMYDIA
Chlamydia: NEGATIVE
Gonorrhea: NEGATIVE

## 2013-11-30 NOTE — L&D Delivery Note (Signed)
Delivery Note At 1:31 AM a healthy female was delivered via Vaginal, Spontaneous Delivery (Presentation: Left Occiput Anterior).  Mild shoulder dystocia overcome with McRoberts and suprapubic pressure.   APGAR: 7, 9; weight pending.  Placenta status: Intact, Spontaneous.  Cord: 3 vessels with the following complications: None.  Cord pH: clotted  Anesthesia: Epidural  Episiotomy: None Lacerations: 1st degree;Perineal Suture Repair: 2.0 3.0 vicryl Est. Blood Loss (mL): 300  Mom to postpartum.  Baby to Nursery.  Marie-Lyne Donna Snooks 03/28/2014, 2:04 AM

## 2014-02-14 LAB — OB RESULTS CONSOLE GBS: GBS: NEGATIVE

## 2014-03-22 ENCOUNTER — Encounter (HOSPITAL_COMMUNITY): Payer: Self-pay | Admitting: *Deleted

## 2014-03-22 ENCOUNTER — Telehealth (HOSPITAL_COMMUNITY): Payer: Self-pay | Admitting: *Deleted

## 2014-03-22 NOTE — Telephone Encounter (Signed)
Preadmission screen  

## 2014-03-26 ENCOUNTER — Other Ambulatory Visit: Payer: Self-pay | Admitting: Obstetrics & Gynecology

## 2014-03-27 ENCOUNTER — Encounter (HOSPITAL_COMMUNITY): Payer: Self-pay

## 2014-03-27 ENCOUNTER — Inpatient Hospital Stay (HOSPITAL_COMMUNITY)
Admission: RE | Admit: 2014-03-27 | Discharge: 2014-03-30 | DRG: 775 | Disposition: A | Discharge status: Home / Self Care | Payer: 59 | Source: Ambulatory Visit | Attending: Obstetrics & Gynecology | Admitting: Obstetrics & Gynecology

## 2014-03-27 ENCOUNTER — Encounter (HOSPITAL_COMMUNITY): Payer: 59 | Admitting: Anesthesiology

## 2014-03-27 ENCOUNTER — Inpatient Hospital Stay (HOSPITAL_COMMUNITY): Payer: 59 | Admitting: Anesthesiology

## 2014-03-27 DIAGNOSIS — Z6839 Body mass index (BMI) 39.0-39.9, adult: Secondary | ICD-10-CM

## 2014-03-27 DIAGNOSIS — O9902 Anemia complicating childbirth: Secondary | ICD-10-CM | POA: Diagnosis present

## 2014-03-27 DIAGNOSIS — O480 Post-term pregnancy: Principal | ICD-10-CM | POA: Diagnosis present

## 2014-03-27 DIAGNOSIS — O99214 Obesity complicating childbirth: Secondary | ICD-10-CM

## 2014-03-27 DIAGNOSIS — Z3490 Encounter for supervision of normal pregnancy, unspecified, unspecified trimester: Secondary | ICD-10-CM

## 2014-03-27 DIAGNOSIS — E669 Obesity, unspecified: Secondary | ICD-10-CM | POA: Diagnosis present

## 2014-03-27 DIAGNOSIS — D649 Anemia, unspecified: Secondary | ICD-10-CM | POA: Diagnosis present

## 2014-03-27 DIAGNOSIS — O34219 Maternal care for unspecified type scar from previous cesarean delivery: Secondary | ICD-10-CM | POA: Diagnosis present

## 2014-03-27 HISTORY — DX: Headache: R51

## 2014-03-27 HISTORY — DX: Adverse effect of unspecified anesthetic, initial encounter: T41.45XA

## 2014-03-27 HISTORY — DX: Other complications of anesthesia, initial encounter: T88.59XA

## 2014-03-27 LAB — RPR

## 2014-03-27 LAB — CBC
HCT: 34 % — ABNORMAL LOW (ref 36.0–46.0)
Hemoglobin: 11.7 g/dL — ABNORMAL LOW (ref 12.0–15.0)
MCH: 32.3 pg (ref 26.0–34.0)
MCHC: 34.4 g/dL (ref 30.0–36.0)
MCV: 93.9 fL (ref 78.0–100.0)
Platelets: 168 10*3/uL (ref 150–400)
RBC: 3.62 MIL/uL — ABNORMAL LOW (ref 3.87–5.11)
RDW: 14.2 % (ref 11.5–15.5)
WBC: 8.8 10*3/uL (ref 4.0–10.5)

## 2014-03-27 LAB — TYPE AND SCREEN
ABO/RH(D): O NEG
Antibody Screen: NEGATIVE

## 2014-03-27 MED ORDER — OXYCODONE-ACETAMINOPHEN 5-325 MG PO TABS: 1.0000 | ORAL_TABLET | ORAL | Status: DC | PRN

## 2014-03-27 MED ORDER — EPHEDRINE 5 MG/ML INJ
10.0000 mg | INTRAVENOUS | Status: DC | PRN
  Filled 2014-03-27: qty 4
  Filled 2014-03-27: qty 2

## 2014-03-27 MED ORDER — ACETAMINOPHEN 325 MG PO TABS: 650.0000 mg | ORAL_TABLET | ORAL | Status: DC | PRN

## 2014-03-27 MED ORDER — OXYTOCIN 40 UNITS IN LACTATED RINGERS INFUSION - SIMPLE MED
1.0000 m[IU]/min | INTRAVENOUS | Status: DC
  Administered 2014-03-27: 2 m[IU]/min via INTRAVENOUS
  Filled 2014-03-27: qty 1000

## 2014-03-27 MED ORDER — FENTANYL 2.5 MCG/ML BUPIVACAINE 1/10 % EPIDURAL INFUSION (WH - ANES)
INTRAMUSCULAR | Status: DC | PRN
  Administered 2014-03-27: 14 mL/h via EPIDURAL

## 2014-03-27 MED ORDER — EPHEDRINE 5 MG/ML INJ
10.0000 mg | INTRAVENOUS | Status: DC | PRN
  Filled 2014-03-27: qty 2

## 2014-03-27 MED ORDER — TERBUTALINE SULFATE 1 MG/ML IJ SOLN: 0.2500 mg | Freq: Once | INTRAMUSCULAR | Status: AC | PRN

## 2014-03-27 MED ORDER — DIPHENHYDRAMINE HCL 50 MG/ML IJ SOLN: 12.5000 mg | INTRAMUSCULAR | Status: DC | PRN

## 2014-03-27 MED ORDER — OXYTOCIN BOLUS FROM INFUSION: 500.0000 mL | INTRAVENOUS | Status: DC

## 2014-03-27 MED ORDER — PHENYLEPHRINE 40 MCG/ML (10ML) SYRINGE FOR IV PUSH (FOR BLOOD PRESSURE SUPPORT)
80.0000 ug | PREFILLED_SYRINGE | INTRAVENOUS | Status: DC | PRN
  Filled 2014-03-27: qty 10
  Filled 2014-03-27: qty 2

## 2014-03-27 MED ORDER — LIDOCAINE HCL (PF) 1 % IJ SOLN
30.0000 mL | INTRAMUSCULAR | Status: DC | PRN
  Filled 2014-03-27: qty 30

## 2014-03-27 MED ORDER — LACTATED RINGERS IV SOLN
INTRAVENOUS | Status: DC
  Administered 2014-03-27 – 2014-03-28 (×4): via INTRAVENOUS

## 2014-03-27 MED ORDER — LACTATED RINGERS IV SOLN
500.0000 mL | INTRAVENOUS | Status: DC | PRN
  Administered 2014-03-28: 500 mL via INTRAVENOUS

## 2014-03-27 MED ORDER — LIDOCAINE HCL (PF) 1 % IJ SOLN
INTRAMUSCULAR | Status: DC | PRN
  Administered 2014-03-27 (×2): 4 mL

## 2014-03-27 MED ORDER — FLEET ENEMA 7-19 GM/118ML RE ENEM
1.0000 | ENEMA | RECTAL | Status: DC | PRN
Start: 2014-03-27 — End: 2014-03-28

## 2014-03-27 MED ORDER — OXYTOCIN 40 UNITS IN LACTATED RINGERS INFUSION - SIMPLE MED: 62.5000 mL/h | INTRAVENOUS | Status: DC

## 2014-03-27 MED ORDER — IBUPROFEN 600 MG PO TABS: 600.0000 mg | ORAL_TABLET | Freq: Four times a day (QID) | ORAL | Status: DC | PRN

## 2014-03-27 MED ORDER — ONDANSETRON HCL 4 MG/2ML IJ SOLN
4.0000 mg | Freq: Four times a day (QID) | INTRAMUSCULAR | Status: DC | PRN
  Administered 2014-03-28: 4 mg via INTRAVENOUS
  Filled 2014-03-27: qty 2

## 2014-03-27 MED ORDER — PHENYLEPHRINE 40 MCG/ML (10ML) SYRINGE FOR IV PUSH (FOR BLOOD PRESSURE SUPPORT)
80.0000 ug | PREFILLED_SYRINGE | INTRAVENOUS | Status: DC | PRN
  Filled 2014-03-27: qty 2

## 2014-03-27 MED ORDER — CITRIC ACID-SODIUM CITRATE 334-500 MG/5ML PO SOLN
30.0000 mL | ORAL | Status: DC | PRN
  Administered 2014-03-27: 30 mL via ORAL
  Filled 2014-03-27: qty 15

## 2014-03-27 MED ORDER — FENTANYL 2.5 MCG/ML BUPIVACAINE 1/10 % EPIDURAL INFUSION (WH - ANES)
14.0000 mL/h | INTRAMUSCULAR | Status: DC | PRN
  Filled 2014-03-27 (×2): qty 125

## 2014-03-27 MED ORDER — LACTATED RINGERS IV SOLN
500.0000 mL | Freq: Once | INTRAVENOUS | Status: AC
  Administered 2014-03-27: 1000 mL via INTRAVENOUS

## 2014-03-27 NOTE — Progress Notes (Signed)
Subjective: Doing well, pain controled, UCs q2-3 min  Anesthesia epidural   Objective: BP 89/69  Pulse 74  Temp(Src) 98 F (36.7 C) (Oral)  Resp 20  Ht 5\' 3"  (1.6 m)  Wt 102.059 kg (225 lb)  BMI 39.87 kg/m2  SpO2 99%   FHT:  FHR: 140 bpm, variability: moderate,  accelerations:  Present,  decelerations:  Absent UC:   regular, every 2-3 minutes VE:   4/75%/Vtx/-2  IUPC put in place easily.  Assessment / Plan:  Induction of labor due to Post-term on Pitocin.  In active labor with slow progression.  Will optimize Pitocin with IUPC.  Fetal Wellbeing:  Category I Pain Control:  Epidural  Anticipated MOD:  NSVD  Holly Boyd 03/27/2014, 8:21 PM

## 2014-03-27 NOTE — Progress Notes (Signed)
Subjective: Doing well, pain +++, UCs q2-3 min  Anesthesia none   Objective: BP 128/75  Pulse 83  Temp(Src) 98 F (36.7 C) (Oral)  Resp 20  Ht 5\' 3"  (1.6 m)  Wt 102.059 kg (225 lb)  BMI 39.87 kg/m2   FHT:  FHR: 140's bpm, variability: moderate,  accelerations:  Present,  decelerations:  Absent UC:   regular, every 2-3 minutes VE:   Dilation: 3.5 Effacement (%): 70 Station: -3 Exam by:: Dr. Seymour BarsLavoie   Assessment / Plan: Induction of labor due to postterm,  progressing well on pitocin Active labor  Fetal Wellbeing:  Category I Pain Control:  Labor support without medications, will have epidural soon.  Anticipated MOD:  NSVD  Holly Boyd 03/27/2014, 5:59 PM

## 2014-03-27 NOTE — Progress Notes (Signed)
Subjective: Doing well, pain controled, UCs q2-4 min, MVU 180-200  Anesthesia epidural   Objective: BP 132/55  Pulse 80  Temp(Src) 97.8 F (36.6 C) (Oral)  Resp 18  Ht 5\' 3"  (1.6 m)  Wt 102.059 kg (225 lb)  BMI 39.87 kg/m2  SpO2 99%   FHT:  FHR: 140 bpm, variability: moderate,  accelerations:  Present,  decelerations:  Absent UC:   regular, every 2-4 minutes VE:   Dilation: 4.5 Effacement (%): 80 Station: -2;-1 Exam by:: Nichoals Heyde, md   Assessment / Plan: Induction of labor due to postterm,  progressing well on pitocin, but slowly. Descending and effacing.  Continuing to increase Pitocin per MVU.  Fetal Wellbeing:  Category I Pain Control:  Epidural  Anticipated MOD:  NSVD  Holly Boyd 03/27/2014, 11:05 PM

## 2014-03-27 NOTE — Progress Notes (Signed)
Subjective: Doing well, pain ++, UCs q3 min  Anesthesia none   Objective: BP 110/77  Pulse 78  Temp(Src) 98.2 F (36.8 C) (Oral)  Resp 18  Ht 5\' 3"  (1.6 m)  Wt 102.059 kg (225 lb)  BMI 39.87 kg/m2   FHT:  FHR: 135 bpm, variability: moderate,  accelerations:  Present,  decelerations:  Absent UC:   regular, every 3 minutes VE:   Dilation: 2.5 Effacement (%): 50 Station: -3 Exam by:: Dr Seymour BarsLavoie AROM AF clear   Assessment / Plan: Induction of labor due to postterm,  progressing well on pitocin  Fetal Wellbeing:  Category I Pain Control:  Labor support without medications  Anticipated MOD:  NSVD  Holly Boyd 03/27/2014, 1:59 PM

## 2014-03-27 NOTE — Anesthesia Procedure Notes (Signed)
Epidural Patient location during procedure: OB Start time: 03/27/2014 6:23 PM  Staffing Anesthesiologist: Haru Shaff A.  Preanesthetic Checklist Completed: patient identified, site marked, surgical consent, pre-op evaluation, timeout performed, IV checked, risks and benefits discussed and monitors and equipment checked  Epidural Patient position: sitting Prep: site prepped and draped and DuraPrep Patient monitoring: continuous pulse ox and blood pressure Approach: midline Location: L3-L4 Injection technique: LOR air  Needle:  Needle type: Tuohy  Needle gauge: 17 G Needle length: 9 cm and 9 Needle insertion depth: 7 cm Catheter type: closed end flexible Catheter size: 19 Gauge Catheter at skin depth: 12 cm Test dose: negative and Other  Assessment Events: blood not aspirated, injection not painful, no injection resistance, negative IV test and no paresthesia  Additional Notes Patient identified. Risks and benefits discussed including failed block, incomplete  Pain control, post dural puncture headache, nerve damage, paralysis, blood pressure Changes, nausea, vomiting, reactions to medications-both toxic and allergic and post Partum back pain. All questions were answered. Patient expressed understanding and wished to proceed. Sterile technique was used throughout procedure. Epidural site was Dressed with sterile barrier dressing. No paresthesias, signs of intravascular injection Or signs of intrathecal spread were encountered.  Patient was more comfortable after the epidural was dosed. Please see RN's note for documentation of vital signs and FHR which are stable.

## 2014-03-27 NOTE — Anesthesia Preprocedure Evaluation (Addendum)
Anesthesia Evaluation  Patient identified by MRN, date of birth, ID band Patient awake    Reviewed: Allergy & Precautions, H&P , Patient's Chart, lab work & pertinent test results  History of Anesthesia Complications (+) history of anesthetic complications  Airway Mallampati: III TM Distance: >3 FB Neck ROM: Full    Dental no notable dental hx. (+) Teeth Intact   Pulmonary neg pulmonary ROS,  breath sounds clear to auscultation  Pulmonary exam normal       Cardiovascular negative cardio ROS  Rhythm:Regular Rate:Normal     Neuro/Psych  Headaches, negative psych ROS   GI/Hepatic negative GI ROS, Neg liver ROS,   Endo/Other  Morbid obesity  Renal/GU negative Renal ROS  negative genitourinary   Musculoskeletal negative musculoskeletal ROS (+)   Abdominal (+) + obese,   Peds  Hematology  (+) anemia ,   Anesthesia Other Findings   Reproductive/Obstetrics (+) Pregnancy Previous C/Section Successful VBAC last pregnancy                          Anesthesia Physical Anesthesia Plan  ASA: III  Anesthesia Plan: Epidural   Post-op Pain Management:    Induction:   Airway Management Planned: Natural Airway  Additional Equipment:   Intra-op Plan:   Post-operative Plan:   Informed Consent: I have reviewed the patients History and Physical, chart, labs and discussed the procedure including the risks, benefits and alternatives for the proposed anesthesia with the patient or authorized representative who has indicated his/her understanding and acceptance.     Plan Discussed with: Anesthesiologist  Anesthesia Plan Comments:         Anesthesia Quick Evaluation

## 2014-03-27 NOTE — H&P (Signed)
Subjective:  Holly Boyd is a 28 y.o. G3 P2L1 female with EDC 03/20/2014 at 7541 and 0/[redacted] weeks gestation who is being admitted for induction of labor.  Her current obstetrical history is significant for prior c-section, post-terme.  Patient reports no complaints.   Fetal Movement: normal.     Objective:   Vital signs in last 24 hours:     General:   alert  Skin:   normal  HEENT:  PERRLA  Lungs:   clear to auscultation bilaterally  Heart:   regular rate and rhythm  Breasts:   Deferred  Abdomen:  Gravid  Pelvis:  Exam deferred.  FHT:  135 BPM, good variability, no deceleration  Uterine Size: size equals dates  Presentations: cephalic  Cervix:    Dilation: 1cm   Effacement: 30%   Station:  -3   Consistency: soft   Position: middle                               VE in office.  Same today, AROM not possible at this time, presentation too high. Lab Review  Rh -, Rhophylac received at 28 wks  AFP:NML, Ultrascreen wnl  Ultrasound normal anatomy  One hour GTT: Normal   GBS neg   Assessment/Plan:  41 and 0/[redacted] weeks gestation.  Obstetrical history significant for prior c-section, post-terme .  Successful VBAC x 1.  Induction for post-dates with low dose Pitocin, AROM later.  Fetal well-being reassuring.  Good fetal growth per recent US.    Risks, benefits, alternatives and possible complications have been discussed in detail with the patient.  Pre-admission, admission, and post admission procedures and expectations were discussed in detail.  All questions answered, all appropriate consents will be signed at the Hospital. Admission is planned for today.  Expectant management. and Anticipate vaginal delivery.

## 2014-03-28 ENCOUNTER — Encounter (HOSPITAL_COMMUNITY): Payer: Self-pay

## 2014-03-28 LAB — CBC
HCT: 32.8 % — ABNORMAL LOW (ref 36.0–46.0)
Hemoglobin: 11.2 g/dL — ABNORMAL LOW (ref 12.0–15.0)
MCH: 32 pg (ref 26.0–34.0)
MCHC: 34.1 g/dL (ref 30.0–36.0)
MCV: 93.7 fL (ref 78.0–100.0)
Platelets: 167 10*3/uL (ref 150–400)
RBC: 3.5 MIL/uL — ABNORMAL LOW (ref 3.87–5.11)
RDW: 14.3 % (ref 11.5–15.5)
WBC: 15.4 10*3/uL — ABNORMAL HIGH (ref 4.0–10.5)

## 2014-03-28 MED ORDER — BENZOCAINE-MENTHOL 20-0.5 % EX AERO: 1.0000 "application " | INHALATION_SPRAY | CUTANEOUS | Status: DC | PRN

## 2014-03-28 MED ORDER — SIMETHICONE 80 MG PO CHEW: 80.0000 mg | CHEWABLE_TABLET | ORAL | Status: DC | PRN

## 2014-03-28 MED ORDER — DIPHENHYDRAMINE HCL 25 MG PO CAPS: 25.0000 mg | ORAL_CAPSULE | Freq: Four times a day (QID) | ORAL | Status: DC | PRN

## 2014-03-28 MED ORDER — OXYTOCIN 40 UNITS IN LACTATED RINGERS INFUSION - SIMPLE MED: 62.5000 mL/h | INTRAVENOUS | Status: DC | PRN

## 2014-03-28 MED ORDER — TETANUS-DIPHTH-ACELL PERTUSSIS 5-2.5-18.5 LF-MCG/0.5 IM SUSP: 0.5000 mL | Freq: Once | INTRAMUSCULAR | Status: DC

## 2014-03-28 MED ORDER — OXYCODONE-ACETAMINOPHEN 5-325 MG PO TABS
1.0000 | ORAL_TABLET | ORAL | Status: DC | PRN
  Administered 2014-03-28 – 2014-03-30 (×4): 1 via ORAL
  Filled 2014-03-28 (×4): qty 1

## 2014-03-28 MED ORDER — DIBUCAINE 1 % RE OINT: 1.0000 "application " | TOPICAL_OINTMENT | RECTAL | Status: DC | PRN

## 2014-03-28 MED ORDER — RHO D IMMUNE GLOBULIN 1500 UNIT/2ML IJ SOLN
300.0000 ug | Freq: Once | INTRAMUSCULAR | Status: AC
  Administered 2014-03-28: 300 ug via INTRAMUSCULAR
  Filled 2014-03-28: qty 2

## 2014-03-28 MED ORDER — ONDANSETRON HCL 4 MG PO TABS: 4.0000 mg | ORAL_TABLET | ORAL | Status: DC | PRN

## 2014-03-28 MED ORDER — SENNOSIDES-DOCUSATE SODIUM 8.6-50 MG PO TABS
2.0000 | ORAL_TABLET | ORAL | Status: DC
  Administered 2014-03-28 – 2014-03-29 (×2): 2 via ORAL
  Filled 2014-03-28 (×2): qty 2

## 2014-03-28 MED ORDER — WITCH HAZEL-GLYCERIN EX PADS
1.0000 | MEDICATED_PAD | CUTANEOUS | Status: DC | PRN
Start: 2014-03-28 — End: 2014-03-30

## 2014-03-28 MED ORDER — IBUPROFEN 600 MG PO TABS
600.0000 mg | ORAL_TABLET | Freq: Four times a day (QID) | ORAL | Status: DC
  Administered 2014-03-28 – 2014-03-30 (×9): 600 mg via ORAL
  Filled 2014-03-28 (×9): qty 1

## 2014-03-28 MED ORDER — ZOLPIDEM TARTRATE 5 MG PO TABS: 5.0000 mg | ORAL_TABLET | Freq: Every evening | ORAL | Status: DC | PRN

## 2014-03-28 MED ORDER — LANOLIN HYDROUS EX OINT: TOPICAL_OINTMENT | CUTANEOUS | Status: DC | PRN

## 2014-03-28 MED ORDER — PRENATAL MULTIVITAMIN CH
1.0000 | ORAL_TABLET | Freq: Every day | ORAL | Status: DC
  Administered 2014-03-28 – 2014-03-29 (×2): 1 via ORAL
  Filled 2014-03-28 (×2): qty 1

## 2014-03-28 MED ORDER — ONDANSETRON HCL 4 MG/2ML IJ SOLN: 4.0000 mg | INTRAMUSCULAR | Status: DC | PRN

## 2014-03-28 NOTE — Anesthesia Postprocedure Evaluation (Signed)
  Anesthesia Post-op Note  Patient: Holly Boyd  Procedure(s) Performed: * No procedures listed *  Patient Location: PACU and Mother/Baby  Anesthesia Type:Epidural  Level of Consciousness: awake, alert  and oriented  Airway and Oxygen Therapy: Patient Spontanous Breathing  Post-op Pain: mild  Post-op Assessment: Patient's Cardiovascular Status Stable, Respiratory Function Stable, No signs of Nausea or vomiting, Adequate PO intake, Pain level controlled, No headache, No backache, No residual numbness and No residual motor weakness  Post-op Vital Signs: Reviewed and stable  Last Vitals:  Filed Vitals:   03/28/14 0500  BP: 96/59  Pulse: 75  Temp: 37.4 C  Resp: 18    Complications: No apparent anesthesia complications

## 2014-03-28 NOTE — Lactation Note (Signed)
This note was copied from the chart of Holly Boyd. Lactation Consultation Note Mother has baby in cradle hold breastfeeding after bath.  A few sucks observed, baby sleepy after bath.  She has been bf for 15 min. Reviewed fb position, STS and  breast massage to stimulate baby at the breast. Mom made aware of O/P services, breastfeeding support groups, community resources, and our phone # for post-discharge questions.  Reviewed Baby & Me pp 20-24 and encouraged mother to call if further assistance is needed.  Patient Name: Holly Boyd Today's Date: 03/28/2014 Reason for consult: Follow-up assessment   Maternal Data    Feeding Feeding Type: Breast Fed Length of feed: 15 min  LATCH Score/Interventions Latch: Grasps breast easily, tongue down, lips flanged, rhythmical sucking.  Audible Swallowing: A few with stimulation  Type of Nipple: Everted at rest and after stimulation  Comfort (Breast/Nipple): Soft / non-tender     Hold (Positioning): Assistance needed to correctly position infant at breast and maintain latch.  LATCH Score: 8  Lactation Tools Discussed/Used     Consult Status Consult Status: Follow-up Date: 03/29/14 Follow-up type: In-patient    Dulce SellarRuth Boschen Berkelhammer 03/28/2014, 12:14 PM

## 2014-03-28 NOTE — Lactation Note (Signed)
This note was copied from the chart of Holly Boyd. Lactation Consultation Note Initial consult:  First baby died at 7017 days old and mother pumped to donate. Mother will be tandem nursing 29 year old.   Reviewed basics, and made aware of O/P services, breastfeeding support groups, community resources, and our phone # for post-discharge questions.  Mother will call for LC to view next feeding.    Patient Name: Holly Boyd Today's Date: 03/28/2014 Reason for consult: Initial assessment   Maternal Data    Feeding Feeding Type: Breast Fed Length of feed: 30 min  LATCH Score/Interventions                      Lactation Tools Discussed/Used     Consult Status Consult Status: Follow-up Date: 03/29/14 Follow-up type: In-patient    Dulce SellarRuth Boschen Vershawn Westrup 03/28/2014, 11:32 AM

## 2014-03-29 LAB — RH IG WORKUP (INCLUDES ABO/RH)
ABO/RH(D): O NEG
Fetal Screen: NEGATIVE
GESTATIONAL AGE(WKS): 41.1
Unit division: 0

## 2014-03-29 NOTE — Progress Notes (Signed)
PPD #1- SVD  Subjective:   Reports feeling well Tolerating po/ No nausea or vomiting Bleeding is light Pain controlled with Motrin Up ad lib / ambulatory / voiding without problems Newborn: breastfeeding  / Phototherapy   Objective:   VS:  VS:  Filed Vitals:   03/28/14 0500 03/28/14 0920 03/28/14 1800 03/29/14 0544  BP: 96/59 101/68 122/90 126/80  Pulse: 75 71 91 90  Temp: 99.4 F (37.4 C) 98 F (36.7 C) 98.1 F (36.7 C) 98.1 F (36.7 C)  TempSrc: Axillary Axillary Oral Oral  Resp: 18 16 18 19   Height:      Weight:      SpO2: 97% 96%  100%    LABS:  Recent Labs  03/27/14 0830 03/28/14 0550  WBC 8.8 15.4*  HGB 11.7* 11.2*  PLT 168 167   Blood type: --/--/O NEG (04/29 0550) Rubella: Immune (09/17 0000)   I&O: Intake/Output     04/29 0701 - 04/30 0700 04/30 0701 - 05/01 0700   Blood     Total Output       Net              Physical Exam: Alert and oriented x3 Abdomen: soft, non-tender, non-distended  Fundus: firm, non-tender, U-1 Perineum: Well approximated, no significant erythema, edema, or drainage; healing well. Lochia: small Extremities: trace edema to BLE, no calf pain or tenderness    Assessment:  PPD # [id number]/ S/P:induced vaginal, 1st degree laceration VBAC  Doing well    Plan: Continue routine post partum orders Anticipate D/C home tomorrow   Lawernce PittsMelanie N Romell Cavanah MSN, CNM 03/29/2014, 11:19 AM

## 2014-03-29 NOTE — Lactation Note (Signed)
This note was copied from the chart of Holly Boyd. Lactation Consultation Note     Follow up consult with this mom and baby, now 34 hours pot partum. Mom is till breast feeding her 29 year old, a couple of times a day. She is comfortable with latching this baby. i reviewed latching a newborn with mom, as opposed to a 29 year old. Mom appreciated this, very receptive to teaching. Baby and Me book  Breast feeding pages reviewed with mom also. Baby on double phototherapy, but seems alert and feeding well- is cluster feeding. Mom knows to call for questions/concerns.  Patient Name: Holly Boyd Today's Date: 03/29/2014 Reason for consult: Follow-up assessment   Maternal Data    Feeding Feeding Type: Breast Fed Length of feed: 35 min  LATCH Score/Interventions Latch: Repeated attempts needed to sustain latch, nipple held in mouth throughout feeding, stimulation needed to elicit sucking reflex. (baby likes to latch to nipple, and needs bottom lip pulled out) Intervention(s): Adjust position;Assist with latch;Breast compression  Audible Swallowing: Spontaneous and intermittent Intervention(s): Hand expression;Skin to skin  Type of Nipple: Everted at rest and after stimulation  Comfort (Breast/Nipple): Soft / non-tender     Hold (Positioning): Assistance needed to correctly position infant at breast and maintain latch. Intervention(s): Breastfeeding basics reviewed;Support Pillows;Position options;Skin to skin  LATCH Score: 8  Lactation Tools Discussed/Used     Consult Status Consult Status: Complete Follow-up type: Call as needed    Alfred LevinsChristine Anne Jery Boyd 03/29/2014, 12:11 PM

## 2014-03-30 MED ORDER — IBUPROFEN 600 MG PO TABS: 600.0000 mg | ORAL_TABLET | Freq: Four times a day (QID) | ORAL | Status: DC

## 2014-03-30 NOTE — Lactation Note (Signed)
This note was copied from the chart of Holly Boyd. Lactation Consultation Note  Patient Name: Holly Boyd Today's Date: 03/30/2014 Reason for consult: Follow-up assessment;Hyperbilirubinemia Mom reports baby is nursing well, denies questions or concerns. Mom is experienced BF. Engorgement care reviewed if needed. Advised of OP services and support group. Encouraged Mom to call if she would like LC assist.   Maternal Data    Feeding    LATCH Score/Interventions                      Lactation Tools Discussed/Used     Consult Status Consult Status: PRN Date: 03/30/14 Follow-up type: In-patient    Kearney HardKathy Ann Cassondra Stachowski 03/30/2014, 9:12 AM

## 2014-03-30 NOTE — Discharge Instructions (Signed)
Breast Pumping Tips °Pumping your breast milk is a good way to stimulate milk production and have a steady supply of breast milk for your infant. Pumping is most helpful during your infant's growth spurts, when involving dad or a family member, or when you are away. There are several types of pumps available. They can be purchased at a baby or maternity store. You can begin pumping soon after delivery, but some experts believe that you should wait about four weeks to give your infant a bottle. °In general, the more you breastfeed or pump, the more milk you will have for your infant. It is also important to take good care of yourself. This will reduce stress and help your body to create a healthy supply of milk. Your caregiver or lactation consultant can give you the information and support you need in your efforts to breastfeed your infant. °PUMPING BREAST MILK  °Follow the tips below for successful breast pumping. °Take care of yourself. °· Drink enough water or fluids to keep urine clear or pale yellow. You may notice a thirsty feeling while breastfeeding. This is because your body needs more water to make breast milk. Keep a large water bottle handy. Make healthy drink choices such as unsweetened fruit juice, milk and water. Limit soda, coffee, and alcohol (wait 2 hours to feed or pump if you have an alcoholic drink.) °· Eat a healthy, well-balanced diet rich in fruits, vegetables, and whole grains. °· Exercise as recommended by your caregiver. °· Get plenty of sleep. Sleep when your infant sleeps. Ask friends and family for help if you need time to nap or rest. °· Do not smoke. Smoking can lower your milk supply and harm your infant. If you need help quitting, ask your caregiver for a program recommendation. °· Ask your caregiver about birth control options. Birth control pills may lower your milk supply. You may be advised to use condoms or other forms of birth control. °Relax and pump °Stimulating your  let-down reflex is the key to successful and effective pumping. This makes the milk in all parts of the breast flow more freely.  °· It is easier to pump breast milk (and breastfeed) while you are relaxed. Find techniques that work for you. Quiet private spaces, breast massage, soothing heat placed on the breast, music, and pictures or a tape recording of your infant may help you to relax and "let down" your milk. If you have difficulty with your let down, try smelling one of your infant's blankets or an item of clothing he or she has worn while you are pumping. °· When pumping, place the special suction cup (flange) directly over the nipple. It may be uncomfortable and cause nipple damage if it is not placed properly or is the wrong size. Applying a small amount of purified or modified lanolin to your nipple and the areola may help increase your comfort level. Also, you can change the speed and suction of many electric pumps to your comfort level. Your caregiver or lactation consultant can help you with this. °· If pumping continues to be painful, or you feel you are not getting very much milk when you pump, you may need a different type of pump. A lactation consultant can help you determine if this is the case. °· If you are with your infant, feed him or her on demand and try pumping after each feeding. This will boost your production, even if milk does not come out. You may not be able   to pump much milk at first, but keep up the routine, and this will change. °· If you are working or away from your infant for several hours, try pumping for about 15 minutes every 2 to 3 hours. Pump both breasts at the same time if you can. °· If your infant has a formula feeding, make sure you pump your milk around the same time to maintain your supply. °· Begin pumping breast milk a few weeks before you return to work. This will help you develop techniques that work for you and will be able to store extra milk. °· Find a source  of breastfeeding information that works well for you. °TIPS FOR STORING BREAST MILK °· Store breast milk in a sealable sterile bag, jar, or container provided with your pumping supplies. °· Store milk in small amounts close to what your infant is drinking at each feeding. °· Cool pumped milk in a refrigerator or cooler. Pumped milk can last at the back of the refrigerator for 3 to 8 days. °· Place cooled milk at the back of the freezer for up to 3 months. °· Thaw the milk in its container or bag in warm water up to 24 hours in advance. Do not use a microwave to thaw or heat milk. Do not refreeze the milk after it has been thawed. °· Breast milk is safe to drink when left at room temperature (mid 70s or colder) for 4 to 8 hours. After that, throw it away. °· Milk fat can separate and look funny. The color can vary slightly from day to day. This is normal. Always shake the milk before using it to mix the fat with the more watery portion. °SEEK MEDICAL CARE IF:  °· You are having trouble pumping or feeding your infant. °· You are concerned that you are not making enough milk. °· You have nipple pain, soreness, or redness. °· You have other questions or concerns related to you or your infant. °Document Released: 05/06/2010 Document Revised: 02/08/2012 Document Reviewed: 05/06/2010 °[employer]® Patient Information ©2014 [employer]. °Postpartum Depression and Baby Blues °The postpartum period begins right after the birth of a baby. During this time, there is often a great amount of joy and excitement. It is also a time of considerable changes in the life of the parent(s). Regardless of how many times a mother gives birth, each child brings new challenges and dynamics to the family. It is not unusual to have feelings of excitement accompanied by confusing shifts in moods, emotions, and thoughts. All mothers are at risk of developing postpartum depression or the "baby blues." These mood changes can occur right after giving  birth, or they may occur many months after giving birth. The baby blues or postpartum depression can be mild or severe. Additionally, postpartum depression can resolve rather quickly, or it can be a long-term condition. °CAUSES °Elevated hormones and their rapid decline are thought to be a main cause of postpartum depression and the baby blues. There are a number of hormones that radically change during and after pregnancy. Estrogen and progesterone usually decrease immediately after delivering your baby. The level of thyroid hormone and various cortisol steroids also rapidly drop. Other factors that play a major role in these changes include major life events and genetics.  °RISK FACTORS °If you have any of the following risks for the baby blues or postpartum depression, know what symptoms to watch out for during the postpartum period. Risk factors that may increase the likelihood of   getting the baby blues or postpartum depression include:  Havinga personal or family history of depression.  Having depression while being pregnant.  Having premenstrual or oral contraceptive-associated mood issues.  Having exceptional life stress.  Having marital conflict.  Lacking a social support network.  Having a baby with special needs.  Having health problems such as diabetes. SYMPTOMS Baby blues symptoms include:  Brief fluctuations in mood, such as going from extreme happiness to sadness.  Decreased concentration.  Difficulty sleeping.  Crying spells, tearfulness.  Irritability.  Anxiety. Postpartum depression symptoms typically begin within the first month after giving birth. These symptoms include:  Difficulty sleeping or excessive sleepiness.  Marked weight loss.  Agitation.  Feelings of worthlessness.  Lack of interest in activity or food. Postpartum psychosis is a very concerning condition and can be dangerous. Fortunately, it is rare. Displaying any of the following symptoms is  cause for immediate medical attention. Postpartum psychosis symptoms include:  Hallucinations and delusions.  Bizarre or disorganized behavior.  Confusion or disorientation. DIAGNOSIS  A diagnosis is made by an evaluation of your symptoms. There are no medical or lab tests that lead to a diagnosis, but there are various questionnaires that a caregiver may use to identify those with the baby blues, postpartum depression, or psychosis. Often times, a screening tool called the [place] Postnatal Depression Scale is used to diagnose depression in the postpartum period.  TREATMENT The baby blues usually goes away on its own in 1 to 2 weeks. Social support is often all that is needed. You should be encouraged to get adequate sleep and rest. Occasionally, you may be given medicines to help you sleep.  Postpartum depression requires treatment as it can last several months or longer if it is not treated. Treatment may include individual or group therapy, medicine, or both to address any social, physiological, and psychological factors that may play a role in the depression. Regular exercise, a healthy diet, rest, and social support may also be strongly recommended.  Postpartum psychosis is more serious and needs treatment right away. Hospitalization is often needed. HOME CARE INSTRUCTIONS  Get as much rest as you can. Nap when the baby sleeps.  Exercise regularly. Some women find yoga and walking to be beneficial.  Eat a balanced and nourishing diet.  Do little things that you enjoy. Have a cup of tea, take a bubble bath, read your favorite magazine, or listen to your favorite music.  Avoid alcohol.  Ask for help with household chores, cooking, grocery shopping, or running errands as needed. Do not try to do everything.  Talk to people close to you about how you are feeling. Get support from your partner, family members, friends, or other new moms.  Try to stay positive in how you think. Think  about the things you are grateful for.  Do not spend a lot of time alone.  Only take medicine as directed by your caregiver.  Keep all your postpartum appointments.  Let your caregiver know if you have any concerns. SEEK MEDICAL CARE IF: You are having a reaction or problems with your medicine. SEEK IMMEDIATE MEDICAL CARE IF:  You have suicidal feelings.  You feel you may harm the baby or someone else. Document Released: 08/20/2004 Document Revised: 02/08/2012 Document Reviewed: 08/28/2013 [facility] Patient Information 2014 [place], [place]. Breastfeeding Deciding to breastfeed is one of the best choices you can make for you and your baby. A change in hormones during pregnancy causes your breast tissue to grow and increases  the number and size of your milk ducts. These hormones also allow proteins, sugars, and fats from your blood supply to make breast milk in your milk-producing glands. Hormones prevent breast milk from being released before your baby is born as well as prompt milk flow after birth. Once breastfeeding has begun, thoughts of your baby, as well as his or her sucking or crying, can stimulate the release of milk from your milk-producing glands.  BENEFITS OF BREASTFEEDING For Your Baby  Your first milk (colostrum) helps your baby's digestive system function better.   There are antibodies in your milk that help your baby fight off infections.   Your baby has a lower incidence of asthma, allergies, and sudden infant death syndrome.   The nutrients in breast milk are better for your baby than infant formulas and are designed uniquely for your baby's needs.   Breast milk improves your baby's brain development.   Your baby is less likely to develop other conditions, such as childhood obesity, asthma, or type 2 diabetes mellitus.  For You   Breastfeeding helps to create a very special bond between you and your baby.   Breastfeeding is convenient. Breast milk is  always available at the correct temperature and costs nothing.   Breastfeeding helps to burn calories and helps you lose the weight gained during pregnancy.   Breastfeeding makes your uterus contract to its prepregnancy size faster and slows bleeding (lochia) after you give birth.   Breastfeeding helps to lower your risk of developing type 2 diabetes mellitus, osteoporosis, and breast or ovarian cancer later in life. SIGNS THAT YOUR BABY IS HUNGRY Early Signs of Hunger  Increased alertness or activity.  Stretching.  Movement of the head from side to side.  Movement of the head and opening of the mouth when the corner of the mouth or cheek is stroked (rooting).  Increased sucking sounds, smacking lips, cooing, sighing, or squeaking.  Hand-to-mouth movements.  Increased sucking of fingers or hands. Late Signs of Hunger  Fussing.  Intermittent crying. Extreme Signs of Hunger Signs of extreme hunger will require calming and consoling before your baby will be able to breastfeed successfully. Do not wait for the following signs of extreme hunger to occur before you initiate breastfeeding:   Restlessness.  A loud, strong cry.   Screaming. BREASTFEEDING BASICS Breastfeeding Initiation  Find a comfortable place to sit or lie down, with your neck and back well supported.  Place a pillow or rolled up blanket under your baby to bring him or her to the level of your breast (if you are seated). Nursing pillows are specially designed to help support your arms and your baby while you breastfeed.  Make sure that your baby's abdomen is facing your abdomen.   Gently massage your breast. With your fingertips, massage from your chest wall toward your nipple in a circular motion. This encourages milk flow. You may need to continue this action during the feeding if your milk flows slowly.  Support your breast with 4 fingers underneath and your thumb above your nipple. Make sure your  fingers are well away from your nipple and your baby's mouth.   Stroke your baby's lips gently with your finger or nipple.   When your baby's mouth is open wide enough, quickly bring your baby to your breast, placing your entire nipple and as much of the colored area around your nipple (areola) as possible into your baby's mouth.   More areola should be visible above your  baby's upper lip than below the lower lip.   Your baby's tongue should be between his or her lower gum and your breast.   Ensure that your baby's mouth is correctly positioned around your nipple (latched). Your baby's lips should create a seal on your breast and be turned out (everted).  It is common for your baby to suck about 2 3 minutes in order to start the flow of breast milk. Latching Teaching your baby how to latch on to your breast properly is very important. An improper latch can cause nipple pain and decreased milk supply for you and poor weight gain in your baby. Also, if your baby is not latched onto your nipple properly, he or she may swallow some air during feeding. This can make your baby fussy. Burping your baby when you switch breasts during the feeding can help to get rid of the air. However, teaching your baby to latch on properly is still the best way to prevent fussiness from swallowing air while breastfeeding. Signs that your baby has successfully latched on to your nipple:    Silent tugging or silent sucking, without causing you pain.   Swallowing heard between every 3 4 sucks.    Muscle movement above and in front of his or her ears while sucking.  Signs that your baby has not successfully latched on to nipple:   Sucking sounds or smacking sounds from your baby while breastfeeding.  Nipple pain. If you think your baby has not latched on correctly, slip your finger into the corner of your baby's mouth to break the suction and place it between your baby's gums. Attempt breastfeeding  initiation again. Signs of Successful Breastfeeding Signs from your baby:   A gradual decrease in the number of sucks or complete cessation of sucking.   Falling asleep.   Relaxation of his or her body.   Retention of a small amount of milk in his or her mouth.   Letting go of your breast by himself or herself. Signs from you:  Breasts that have increased in firmness, weight, and size 1 3 hours after feeding.   Breasts that are softer immediately after breastfeeding.  Increased milk volume, as well as a change in milk consistency and color by the 5th day of breastfeeding.   Nipples that are not sore, cracked, or bleeding. Signs That Your Pecola LeisureBaby is Getting Enough Milk  Wetting at least 3 diapers in a 24-hour period. The urine should be clear and pale yellow by age 29 days.  At least 3 stools in a 24-hour period by age 29 days. The stool should be soft and yellow.  At least 3 stools in a 24-hour period by age 15 days. The stool should be seedy and yellow.  No loss of weight greater than 10% of birth weight during the first 263 days of age.  Average weight gain of 4 7 ounces (120 210 mL) per week after age 79 days.  Consistent daily weight gain by age 29 days, without weight loss after the age of 2 weeks. After a feeding, your baby may spit up a small amount. This is common. BREASTFEEDING FREQUENCY AND DURATION Frequent feeding will help you make more milk and can prevent sore nipples and breast engorgement. Breastfeed when you feel the need to reduce the fullness of your breasts or when your baby shows signs of hunger. This is called "breastfeeding on demand." Avoid introducing a pacifier to your baby while you are working to establish  breastfeeding (the first 4 6 weeks after your baby is born). After this time you may choose to use a pacifier. Research has shown that pacifier use during the first year of a baby's life decreases the risk of sudden infant death syndrome (SIDS). Allow  your baby to feed on each breast as long as he or she wants. Breastfeed until your baby is finished feeding. When your baby unlatches or falls asleep while feeding from the first breast, offer the second breast. Because newborns are often sleepy in the first few weeks of life, you may need to awaken your baby to get him or her to feed. Breastfeeding times will vary from baby to baby. However, the following rules can serve as a guide to help you ensure that your baby is properly fed:  Newborns (babies 20 weeks of age or younger) may breastfeed every 1 3 hours.  Newborns should not go longer than 3 hours during the day or 5 hours during the night without breastfeeding.  You should breastfeed your baby a minimum of 8 times in a 24-hour period until you begin to introduce solid foods to your baby at around 83 months of age. BREAST MILK PUMPING Pumping and storing breast milk allows you to ensure that your baby is exclusively fed your breast milk, even at times when you are unable to breastfeed. This is especially important if you are going back to work while you are still breastfeeding or when you are not able to be present during feedings. Your lactation consultant can give you guidelines on how long it is safe to store breast milk.  A breast pump is a machine that allows you to pump milk from your breast into a sterile bottle. The pumped breast milk can then be stored in a refrigerator or freezer. Some breast pumps are operated by hand, while others use electricity. Ask your lactation consultant which type will work best for you. Breast pumps can be purchased, but some hospitals and breastfeeding support groups lease breast pumps on a monthly basis. A lactation consultant can teach you how to hand express breast milk, if you prefer not to use a pump.  CARING FOR YOUR BREASTS WHILE YOU BREASTFEED Nipples can become dry, cracked, and sore while breastfeeding. The following recommendations can help keep your  breasts moisturized and healthy:  Avoid using soap on your nipples.   Wear a supportive bra. Although not required, special nursing bras and tank tops are designed to allow access to your breasts for breastfeeding without taking off your entire bra or top. Avoid wearing underwire style bras or extremely tight bras.  Air dry your nipples for 3 after each feeding.   Use only cotton bra pads to absorb leaked breast milk. Leaking of breast milk between feedings is normal.   Use lanolin on your nipples after breastfeeding. Lanolin helps to maintain your skin's normal moisture barrier. If you use pure lanolin you do not need to wash it off before feeding your baby again. Pure lanolin is not toxic to your baby. You may also hand express a few drops of breast milk and gently massage that milk into your nipples and allow the milk to air dry. In the first few weeks after giving birth, some women experience extremely full breasts (engorgement). Engorgement can make your breasts feel heavy, warm, and tender to the touch. Engorgement peaks within 3 5 days after you give birth. The following recommendations can help ease engorgement:  Completely empty your breasts  while breastfeeding or pumping. You may want to start by applying warm, moist heat (in the shower or with warm water-soaked hand towels) just before feeding or pumping. This increases circulation and helps the milk flow. If your baby does not completely empty your breasts while breastfeeding, pump any extra milk after he or she is finished.  Wear a snug bra (nursing or regular) or tank top for 1 2 days to signal your body to slightly decrease milk production.  Apply ice packs to your breasts, unless this is too uncomfortable for you.  Make sure that your baby is latched on and positioned properly while breastfeeding. If engorgement persists after 48 hours of following these recommendations, contact your health care provider or a  Advertising copywriter. OVERALL HEALTH CARE RECOMMENDATIONS WHILE BREASTFEEDING  Eat healthy foods. Alternate between meals and snacks, eating 3 of each per day. Because what you eat affects your breast milk, some of the foods may make your baby more irritable than usual. Avoid eating these foods if you are sure that they are negatively affecting your baby.  Drink milk, fruit juice, and water to satisfy your thirst (about 10 glasses a day).   Rest often, relax, and continue to take your prenatal vitamins to prevent fatigue, stress, and anemia.  Continue breast self-awareness checks.  Avoid chewing and smoking tobacco.  Avoid alcohol and drug use. Some medicines that may be harmful to your baby can pass through breast milk. It is important to ask your health care provider before taking any medicine, including all over-the-counter and prescription medicine as well as vitamin and herbal supplements. It is possible to become pregnant while breastfeeding. If birth control is desired, ask your health care provider about options that will be safe for your baby. SEEK MEDICAL CARE IF:   You feel like you want to stop breastfeeding or have become frustrated with breastfeeding.  You have painful breasts or nipples.  Your nipples are cracked or bleeding.  Your breasts are red, tender, or warm.  You have a swollen area on either breast.  You have a fever or chills.  You have nausea or vomiting.  You have drainage other than breast milk from your nipples.  Your breasts do not become full before feedings by the 5th day after you give birth.  You feel sad and depressed.  Your baby is too sleepy to eat well.  Your baby is having trouble sleeping.   Your baby is wetting less than 3 diapers in a 24-hour period.  Your baby has less than 3 stools in a 24-hour period.  Your baby's skin or the white part of his or her eyes becomes yellow.   Your baby is not gaining weight by 5 days of  age. SEEK IMMEDIATE MEDICAL CARE IF:   Your baby is overly tired (lethargic) and does not want to wake up and feed.  Your baby develops an unexplained fever. Document Released: 11/16/2005 Document Revised: 07/19/2013 Document Reviewed: 05/10/2013 [facility] Patient Information 2014 [place], [place]. Breastfeeding and Mastitis Mastitis is inflammation of the breast tissue. It can occur in women who are breastfeeding. This can make breastfeeding painful. Mastitis will sometimes go away on its own. Your health care provider will help determine if treatment is needed. CAUSES Mastitis is often associated with a blocked milk (lactiferous) duct. This can happen when too much milk builds up in the breast. Causes of excess milk in the breast can include:  Poor latch-on. If your baby is not latched onto  the breast properly, she or he may not empty your breast completely while breastfeeding.  Allowing too much time to pass between feedings.  Wearing a bra or other clothing that is too tight. This puts extra pressure on the lactiferous ducts so milk does not flow through them as it should. Mastitis can also be caused by a bacterial infection. Bacteria may enter the breast tissue through cuts or openings in the skin. In women who are breastfeeding, this may occur because of cracked or irritated skin. Cracks in the skin are often caused when your baby does not latch on properly to the breast. SIGNS AND SYMPTOMS  Swelling, redness, tenderness, and pain in an area of the breast.  Swelling of the glands under the arm on the same side.  Fever may or may not accompany mastitis. If an infection is allowed to progress, a collection of pus (abscess) may develop. DIAGNOSIS  Your health care provider can usually diagnose mastitis based on your symptoms and a physical exam. Tests may be done to help confirm the diagnosis. These may include:  Removal of pus from the breast by applying pressure to the area. This pus  can be examined in the lab to determine which bacteria are present. If an abscess has developed, the fluid in the abscess can be removed with a needle. This can also be used to confirm the diagnosis and determine the bacteria present. In most cases, pus will not be present.  Blood tests to determine if your body is fighting a bacterial infection.  Mammogram or ultrasound tests to rule out other problems or diseases. TREATMENT  Mastitis that occurs with breastfeeding will sometimes go away on its own. Your health care provider may choose to wait 24 hours after first seeing you to decide whether a prescription medicine is needed. If your symptoms are worse after 24 hours, your health care provider will likely prescribe an antibiotic to treat the mastitis. He or she will determine which bacteria are most likely causing the infection and will then select an appropriate antibiotic. This is sometimes changed based on the results of tests performed to identify the bacteria, or if there is no response to the antibiotic selected. Antibiotics are usually given by mouth. You may also be given medicine for pain. HOME CARE INSTRUCTIONS  Only take over-the-counter or prescription medicines for pain, fever, or discomfort as directed by your health care provider.  If your health care provider prescribed an antibiotic, take the medicine as directed. Make sure you finish it even if you start to feel better.  Do not wear a tight or underwire bra. Wear a soft, supportive bra.  Increase your fluid intake, especially if you have a fever.  Continue to empty the breast. Your health care provider can tell you whether this milk is safe for your infant or needs to be thrown out. You may be told to stop nursing until your health care provider thinks it is safe for your baby. Use a breast pump if you are advised to stop nursing.  Keep your nipples clean and dry.  Empty the first breast completely before going to the other  breast. If your baby is not emptying your breasts completely for some reason, use a breast pump to empty your breasts.  If you go back to work, pump your breasts while at work to stay in time with your nursing schedule.  Avoid allowing your breasts to become overly filled with milk (engorged). SEEK MEDICAL CARE IF:  You have pus-like discharge from the breast.  Your symptoms do not improve with the treatment prescribed by your health care provider within 2 days. SEEK IMMEDIATE MEDICAL CARE IF:  Your pain and swelling are getting worse.  You have pain that is not controlled with medicine.  You have a red line extending from the breast toward your armpit.  You have a fever or persistent symptoms for more than 2 3 days.  You have a fever and your symptoms suddenly get worse. MAKE SURE YOU:   Understand these instructions.  Will watch your condition.  Will get help right away if you are not doing well or get worse. Document Released: 03/13/2005 Document Revised: 07/19/2013 Document Reviewed: 06/22/2013 [facility] Patient Information 2014 [place], [place].

## 2014-03-30 NOTE — Discharge Summary (Signed)
Obstetric Discharge Summary  Reason for Admission: Pt is a [id number] at 2558w1d  [facility] since 9.1 wks, with Dr. Seymour BarsLavoie as primary provider.  Medications on Admission: Prescriptions prior to admission  Medication Sig Dispense Refill  . acetaminophen (TYLENOL) 325 MG tablet Take 650 mg by mouth every 6 (six) hours as needed for headache.      . Prenatal Vit-Min-FA-Fish Oil ([employer] PRENATAL GUMMY PO) Take 2 tablets by mouth daily.      . ranitidine (ZANTAC) 150 MG tablet Take 150 mg by mouth 2 (two) times daily as needed. For heartburn        Prenatal Labs: ABO, Rh: O NEG (04/29 0550) / Rhophylac done on 03/29/2014 Antibody: NEG (04/28 0830) Rubella: Immune (09/17 0000)  RPR: NON REAC (04/28 0830)  HBsAg: Negative (09/17 0000)  HIV: Non-reactive (09/17 0000)  GTT : 175 mg/dL / normal 3 hr GTT GBS: Negative (03/18 0000)   Prenatal Procedures: ultrasound Intrapartum Procedures: spontaneous vaginal delivery Postpartum Procedures: none Complications-Operative and Postpartum: 1st degree perineal laceration  Labs: Hemoglobin  Date Value Ref Range Status  03/28/2014 11.2* 12.0 - 15.0 g/dL Final     HCT  Date Value Ref Range Status  03/28/2014 32.8* 36.0 - 46.0 % Final   Lab Results  Component Value Date   PLT 167 03/28/2014    Newborn Data: Live born female  Birth Weight: 7 lb 13.4 oz (3555 g) APGAR: 7, 9  Home with mother.   Discharge Information: Date: 03/30/2014 Discharge Diagnoses:  Pt is a [id number] at 7158w1d S/P Term Pregnancy-delivered on 03/28/2014  Condition: stable Activity: pelvic rest Diet: routine Medications:    Medication List         acetaminophen 325 MG tablet  Commonly known as:  TYLENOL  Take 650 mg by mouth every 6 (six) hours as needed for headache.     [employer] PRENATAL GUMMY PO  Take 2 tablets by mouth daily.     ibuprofen 600 MG tablet  Commonly known as:  ADVIL,MOTRIN  Take 1 tablet (600 mg total) by mouth every  6 (six) hours.     ranitidine 150 MG tablet  Commonly known as:  ZANTAC  Take 150 mg by mouth 2 (two) times daily as needed. For heartburn       Instructions: [facility] instruction booklet has been given and reviewed Discharge to: home     Follow-up Information   Follow up with LAVOIE,MARIE-LYNE, MD. Schedule an appointment as soon as possible for a visit in 6 weeks. (For postpartum visit.)    Specialty:  Obstetrics and Gynecology   Contact information:   [address] [place] [id number] [phone]       Kenard Gowerolitta M Amey Hossain, MSN, CNM 03/30/2014, 9:15 AM

## 2014-03-30 NOTE — Progress Notes (Signed)
PPD #2- SVD  Subjective:   Reports feeling well Tolerating po/ No nausea or vomiting Bleeding is light Pain controlled with Motrin Up ad lib / ambulatory / voiding without problems Newborn: breastfeeding  / Phototherapy   Objective:   VS:  VS:  Filed Vitals:   03/28/14 1800 03/29/14 0544 03/29/14 1800 03/30/14 0615  BP: 122/90 126/80 117/80 125/79  Pulse: 91 90 78 72  Temp: 98.1 F (36.7 C) 98.1 F (36.7 C) 98 F (36.7 C) 98 F (36.7 C)  TempSrc: Oral Oral Oral Oral  Resp: 18 19 18 18   Height:      Weight:      SpO2:  100%  97%    LABS:   Recent Labs  03/28/14 0550  WBC 15.4*  HGB 11.2*  PLT 167   Blood type: --/--/O NEG (04/29 0550) Rubella: Immune (09/17 0000)   I&O: Intake/Output   None     Physical Exam: Alert and oriented x3 Abdomen: soft, non-tender, non-distended  Fundus: firm, non-tender, U-2 Perineum: Well approximated, no significant erythema, edema, or drainage; healing well. Lochia: small Extremities: trace edema to BLE, no calf pain or tenderness    Assessment:  PPD # 2 / G3P3002/ S/P:induced vaginal, 1st degree laceration VBAC  Doing well    Plan: Continue routine post partum orders D/C home this AM   Holly Gowerolitta M Marialice Newkirk MSN, CNM 03/30/2014, 9:05 AM

## 2014-04-03 NOTE — Discharge Summary (Signed)
Agree Dr Genia DelMarie-Lyne [facility]

## 2014-10-01 ENCOUNTER — Encounter (HOSPITAL_COMMUNITY): Payer: Self-pay

## 2015-08-15 ENCOUNTER — Other Ambulatory Visit (HOSPITAL_COMMUNITY): Payer: Self-pay | Admitting: Internal Medicine

## 2015-08-15 DIAGNOSIS — K829 Disease of gallbladder, unspecified: Secondary | ICD-10-CM

## 2015-08-19 ENCOUNTER — Ambulatory Visit (HOSPITAL_COMMUNITY)
Admission: RE | Admit: 2015-08-19 | Discharge: 2015-08-19 | Disposition: A | Discharge status: Home / Self Care | Payer: 59 | Source: Ambulatory Visit | Attending: Internal Medicine | Admitting: Internal Medicine

## 2015-08-19 DIAGNOSIS — R1011 Right upper quadrant pain: Secondary | ICD-10-CM | POA: Diagnosis present

## 2015-08-19 DIAGNOSIS — R932 Abnormal findings on diagnostic imaging of liver and biliary tract: Secondary | ICD-10-CM | POA: Diagnosis not present

## 2015-08-19 DIAGNOSIS — K829 Disease of gallbladder, unspecified: Secondary | ICD-10-CM | POA: Insufficient documentation

## 2015-08-19 DIAGNOSIS — R112 Nausea with vomiting, unspecified: Secondary | ICD-10-CM | POA: Diagnosis not present

## 2016-09-17 IMAGING — US US ABDOMEN LIMITED
1 series · 14 of 25 positions shown · non-contrast
Comparison: None.

CLINICAL DATA: Right upper quadrant pain with nausea and vomiting
for 3 weeks. Gallbladder disease.

EXAM:
US ABDOMEN LIMITED - RIGHT UPPER QUADRANT

[Series 1: us abdomen limited · 0.21mm/px · 14 of 53 slices shown]
[im 1/53]
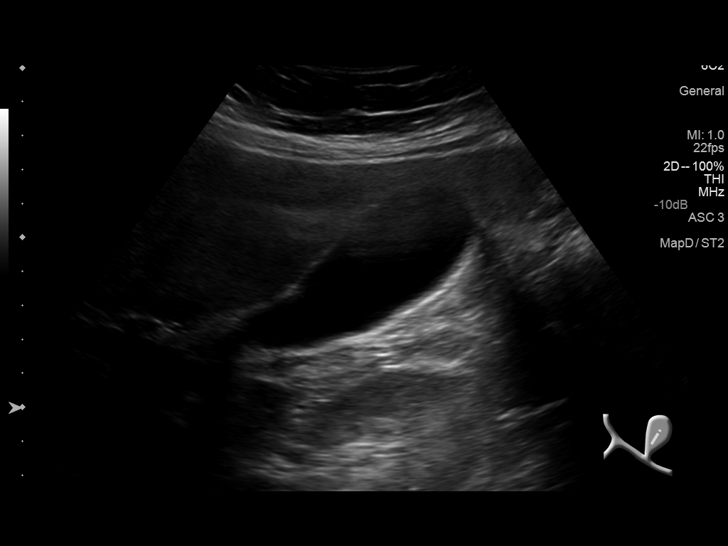
[im 5/53]
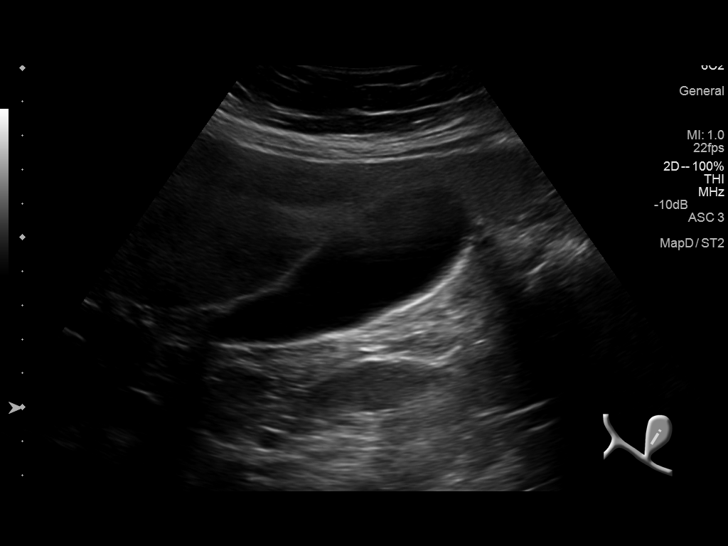
[im 9/53]
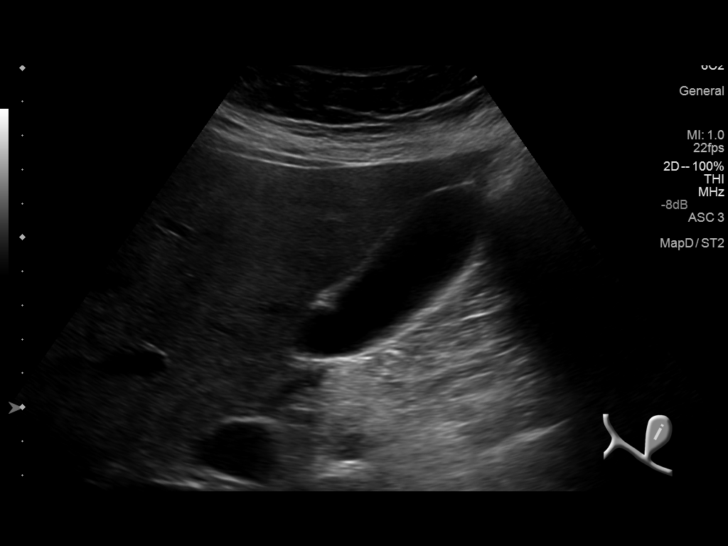
[im 14/53]
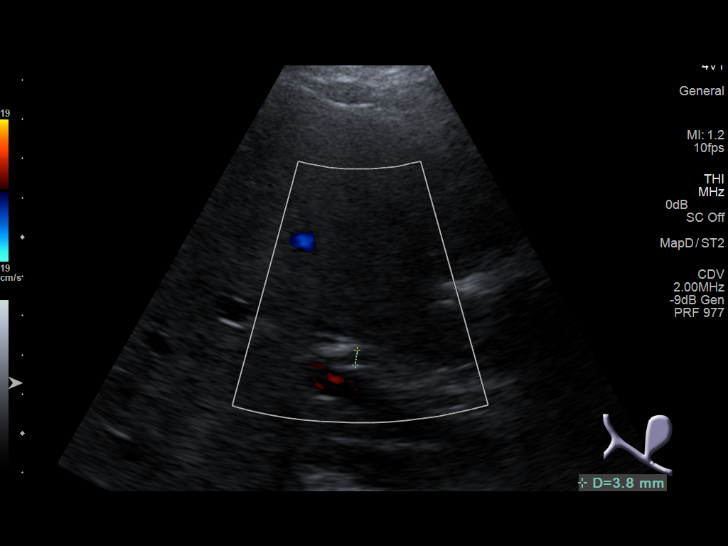
[im 18/53]
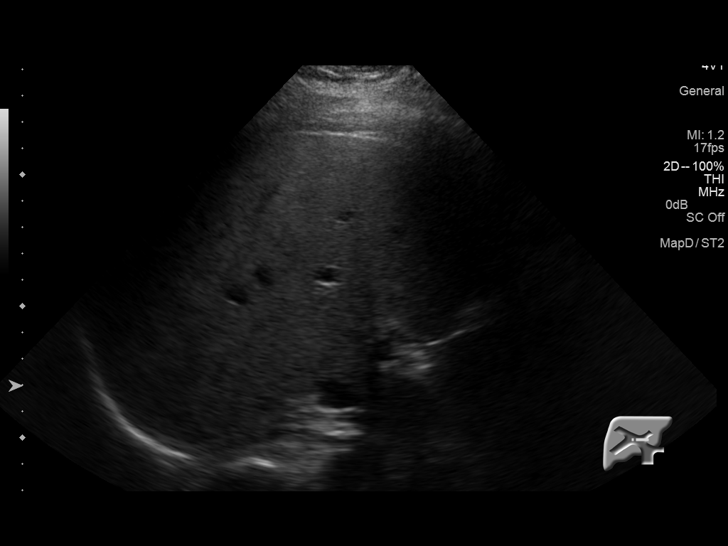
[im 20/53]
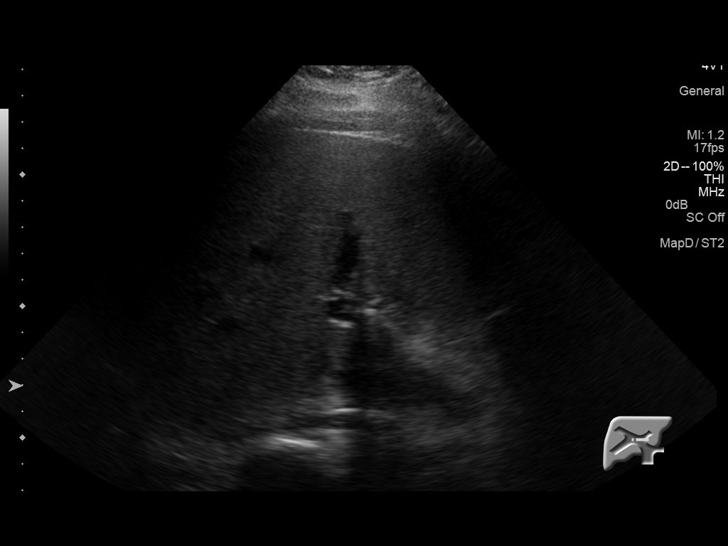
[im 24/53]
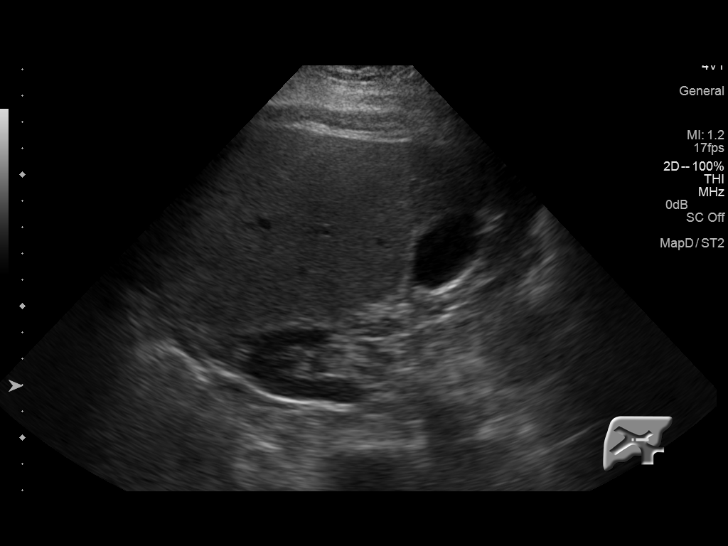
[im 29/53]
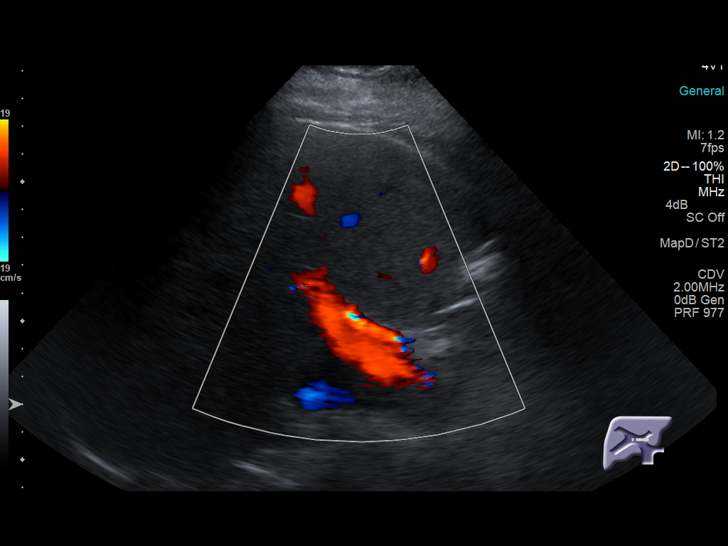
[im 33/53]
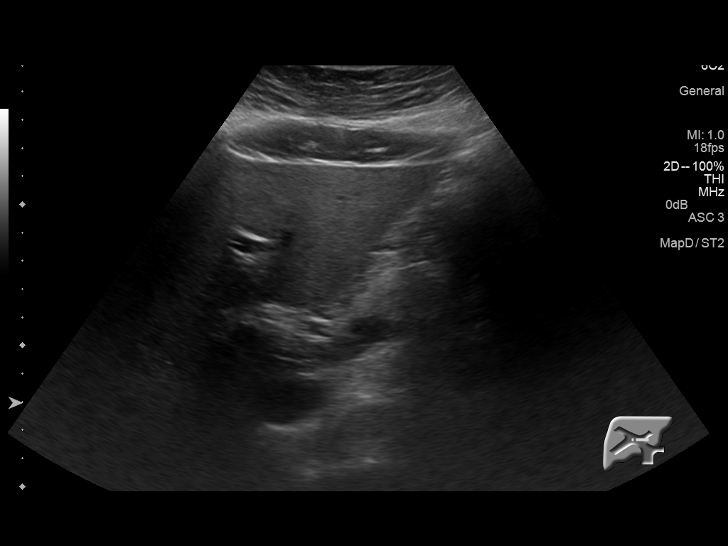
[im 35/53]
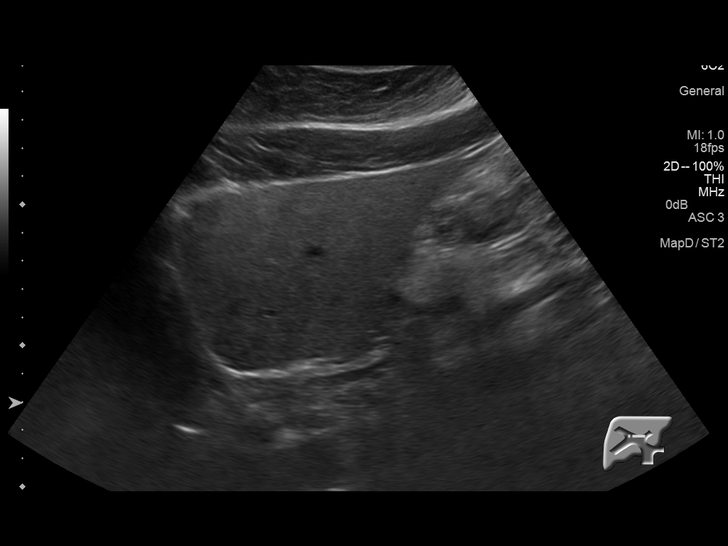
[im 40/53]
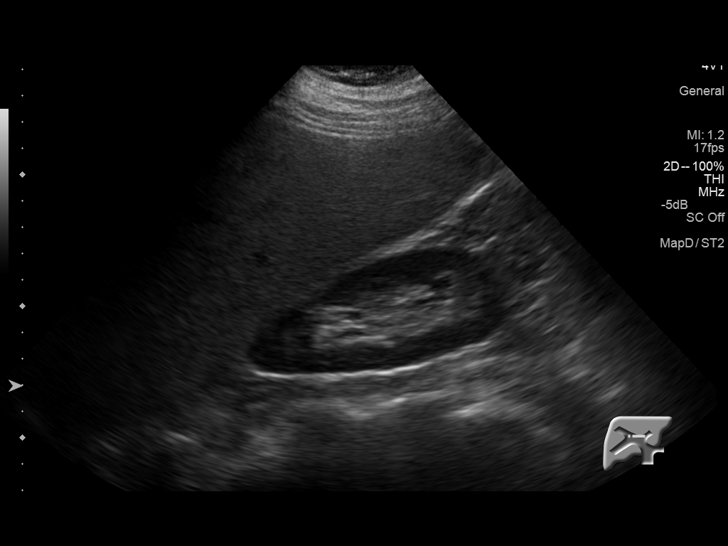
[im 44/53]
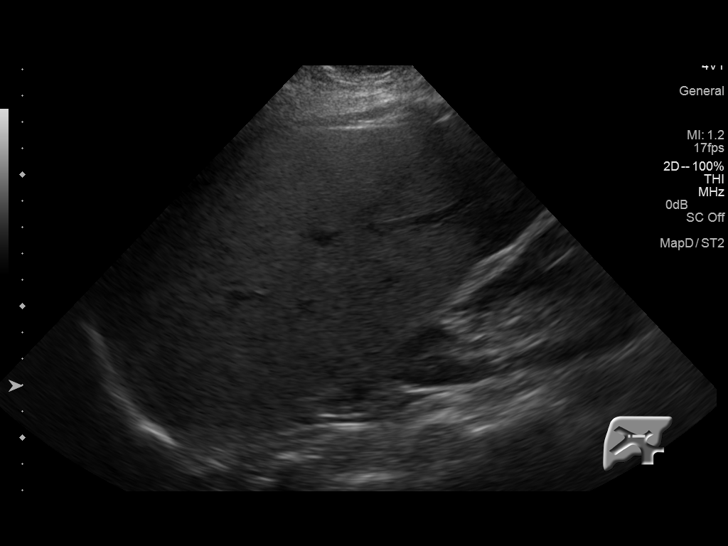
[im 48/53]
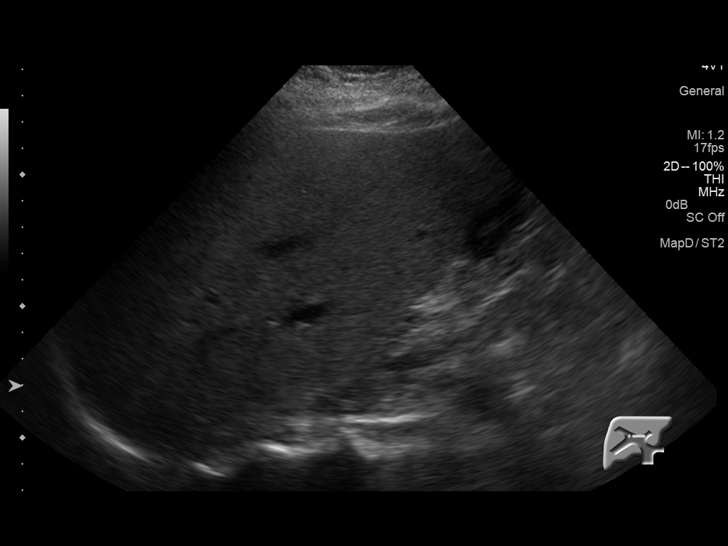
[im 53/53]
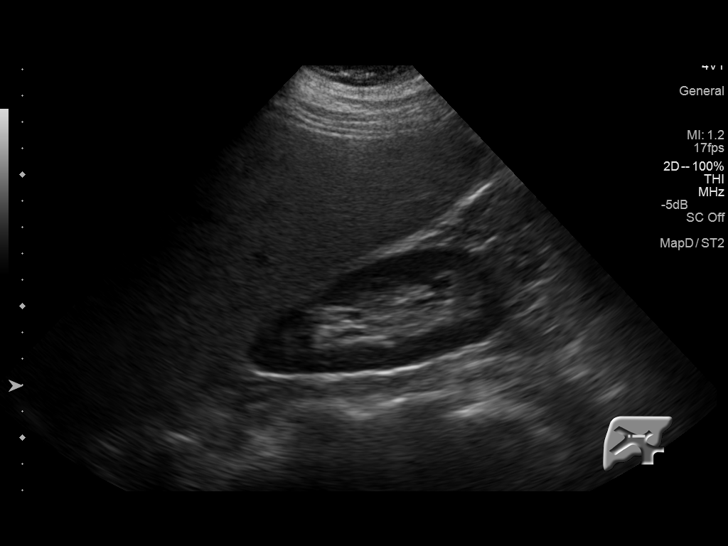

[14 of 25 positions shown; findings below may reference images not displayed]

FINDINGS: Gallbladder:

Gallbladder is mildly distended but otherwise unremarkable. No
gallbladder wall thickening or pericholecystic fluid. No gallstones.
No sonographic Murphy's sign elicited, per the sonographer.

Common bile duct:

Diameter: Normal caliber at 4 mm. No bile duct stone seen. No
intrahepatic bile duct dilatation.

Liver:

Liver appears echogenic and generous in size suggesting fatty
infiltration. No focal mass or lesion identified within the liver.
No free fluid within the adjacent right upper abdomen.
IMPRESSION: 1. Gallbladder appears normal. No evidence of cholecystitis. No
gallstones.
2. No intrahepatic or extrahepatic bile duct dilatation. No bile
duct stone seen.
3. Fatty infiltration of the liver. Probable associated
hepatomegaly.

## 2016-12-28 DIAGNOSIS — J060 Acute laryngopharyngitis: Secondary | ICD-10-CM | POA: Diagnosis not present

## 2016-12-28 DIAGNOSIS — R05 Cough: Secondary | ICD-10-CM | POA: Diagnosis not present

## 2017-02-17 DIAGNOSIS — J060 Acute laryngopharyngitis: Secondary | ICD-10-CM | POA: Diagnosis not present

## 2017-02-17 DIAGNOSIS — H6120 Impacted cerumen, unspecified ear: Secondary | ICD-10-CM | POA: Diagnosis not present

## 2017-09-10 DIAGNOSIS — Z23 Encounter for immunization: Secondary | ICD-10-CM | POA: Diagnosis not present

## 2017-12-08 DIAGNOSIS — S134XXA Sprain of ligaments of cervical spine, initial encounter: Secondary | ICD-10-CM | POA: Diagnosis not present

## 2017-12-08 DIAGNOSIS — S336XXA Sprain of sacroiliac joint, initial encounter: Secondary | ICD-10-CM | POA: Diagnosis not present

## 2017-12-08 DIAGNOSIS — M5124 Other intervertebral disc displacement, thoracic region: Secondary | ICD-10-CM | POA: Diagnosis not present

## 2017-12-10 DIAGNOSIS — M5124 Other intervertebral disc displacement, thoracic region: Secondary | ICD-10-CM | POA: Diagnosis not present

## 2017-12-10 DIAGNOSIS — S336XXA Sprain of sacroiliac joint, initial encounter: Secondary | ICD-10-CM | POA: Diagnosis not present

## 2017-12-10 DIAGNOSIS — S134XXA Sprain of ligaments of cervical spine, initial encounter: Secondary | ICD-10-CM | POA: Diagnosis not present

## 2017-12-23 ENCOUNTER — Encounter: Payer: Self-pay | Admitting: Obstetrics & Gynecology

## 2018-02-15 ENCOUNTER — Encounter: Payer: Self-pay | Admitting: Obstetrics & Gynecology

## 2018-05-04 DIAGNOSIS — J060 Acute laryngopharyngitis: Secondary | ICD-10-CM | POA: Diagnosis not present

## 2018-05-18 ENCOUNTER — Encounter: Payer: Self-pay | Admitting: Obstetrics & Gynecology

## 2018-05-18 DIAGNOSIS — Z0289 Encounter for other administrative examinations: Secondary | ICD-10-CM

## 2018-07-04 DIAGNOSIS — Z79899 Other long term (current) drug therapy: Secondary | ICD-10-CM | POA: Diagnosis not present

## 2018-12-13 DIAGNOSIS — Z23 Encounter for immunization: Secondary | ICD-10-CM | POA: Diagnosis not present

## 2019-02-20 DIAGNOSIS — R0981 Nasal congestion: Secondary | ICD-10-CM | POA: Diagnosis not present

## 2019-02-20 DIAGNOSIS — J309 Allergic rhinitis, unspecified: Secondary | ICD-10-CM | POA: Diagnosis not present

## 2019-04-19 DIAGNOSIS — K219 Gastro-esophageal reflux disease without esophagitis: Secondary | ICD-10-CM | POA: Diagnosis not present

## 2019-09-12 ENCOUNTER — Other Ambulatory Visit: Payer: Self-pay

## 2019-09-12 DIAGNOSIS — Z20822 Contact with and (suspected) exposure to covid-19: Secondary | ICD-10-CM

## 2019-09-14 LAB — NOVEL CORONAVIRUS, NAA: SARS-CoV-2, NAA: NOT DETECTED

## 2022-06-10 ENCOUNTER — Other Ambulatory Visit (HOSPITAL_COMMUNITY): Payer: Self-pay

## 2022-06-10 MED ORDER — WEGOVY 0.5 MG/0.5ML ~~LOC~~ SOAJ
0.5000 mg | SUBCUTANEOUS | 0 refills | Status: AC
  Filled 2022-06-10: qty 2, 28d supply, fill #0

## 2022-06-10 MED ORDER — SAXENDA 18 MG/3ML ~~LOC~~ SOPN
PEN_INJECTOR | SUBCUTANEOUS | 2 refills | Status: DC
  Filled 2022-06-10: qty 3, 28d supply, fill #0

## 2022-06-16 ENCOUNTER — Other Ambulatory Visit (HOSPITAL_COMMUNITY): Payer: Self-pay

## 2022-06-17 ENCOUNTER — Other Ambulatory Visit (HOSPITAL_COMMUNITY): Payer: Self-pay

## 2022-06-18 ENCOUNTER — Other Ambulatory Visit (HOSPITAL_COMMUNITY): Payer: Self-pay

## 2022-06-18 MED ORDER — SAXENDA 18 MG/3ML ~~LOC~~ SOPN
0.6000 mg | PEN_INJECTOR | Freq: Every day | SUBCUTANEOUS | 2 refills | Status: DC
  Filled 2022-06-18: qty 3, 30d supply, fill #0

## 2022-06-19 ENCOUNTER — Other Ambulatory Visit (HOSPITAL_COMMUNITY): Payer: Self-pay

## 2022-06-19 MED ORDER — SAXENDA 18 MG/3ML ~~LOC~~ SOPN
PEN_INJECTOR | SUBCUTANEOUS | 2 refills | Status: DC
  Filled 2022-06-19: qty 3, 30d supply, fill #0

## 2022-07-08 ENCOUNTER — Other Ambulatory Visit (HOSPITAL_COMMUNITY): Payer: Self-pay

## 2022-07-09 ENCOUNTER — Other Ambulatory Visit (HOSPITAL_COMMUNITY): Payer: Self-pay

## 2022-07-11 ENCOUNTER — Other Ambulatory Visit (HOSPITAL_COMMUNITY): Payer: Self-pay

## 2022-07-20 ENCOUNTER — Other Ambulatory Visit (HOSPITAL_COMMUNITY): Payer: Self-pay

## 2022-08-13 ENCOUNTER — Other Ambulatory Visit (HOSPITAL_COMMUNITY): Payer: Self-pay

## 2023-03-25 ENCOUNTER — Encounter: Payer: Self-pay | Admitting: Obstetrics & Gynecology

## 2023-03-25 ENCOUNTER — Ambulatory Visit: Payer: 59 | Admitting: Obstetrics & Gynecology

## 2023-03-25 VITALS — BP 122/83 | HR 87 | Ht 63.0 in | Wt 164.0 lb

## 2023-03-25 DIAGNOSIS — O034 Incomplete spontaneous abortion without complication: Secondary | ICD-10-CM

## 2023-03-25 DIAGNOSIS — Z3A00 Weeks of gestation of pregnancy not specified: Secondary | ICD-10-CM | POA: Diagnosis not present

## 2023-03-25 MED ORDER — MISOPROSTOL 200 MCG PO TABS
ORAL_TABLET | ORAL | 0 refills | Status: DC
Start: 2023-03-25 — End: 2023-05-14

## 2023-03-25 MED ORDER — KETOROLAC TROMETHAMINE 10 MG PO TABS: 10.0000 mg | ORAL_TABLET | Freq: Three times a day (TID) | ORAL | 0 refills | Status: DC | PRN

## 2023-03-25 NOTE — Progress Notes (Signed)
Follow up appointment for results: miscarriage  Chief Complaint  Patient presents with   Miscarriage    Had ultrasound at [facility], retained products.     Blood pressure 122/83, pulse 87, height  (1.6 m), weight 164 lb (74.4 kg), last menstrual period 12/27/2022, not currently breastfeeding.      MEDS ordered this encounter: Meds ordered this encounter  Medications   misoprostol (CYTOTEC) 200 MCG tablet    Sig: 4 tablets, 800 mcg, bucally.  Repeat in 24 hours    Dispense:  8 tablet    Refill:  0   misoprostol (CYTOTEC) 200 MCG tablet    Sig: 2 tablets 400 mcg 3 times a day for 3 days    Dispense:  18 tablet    Refill:  0   ketorolac (TORADOL) 10 MG tablet    Sig: Take 1 tablet (10 mg total) by mouth every 8 (eight) hours as needed.    Dispense:  15 tablet    Refill:  0    Orders for this encounter: No orders of the defined types were placed in this encounter.   Impression + Management Plan   ICD-10-CM   1. Incomplete abortion  O03.4    took medication around March 17th for elective termination.  has collapsed gestational sac present mostly thick endometrium with focal flow, incomplete ab      Follow Up: Return in about 2 weeks (around 04/08/2023) for Follow up, with Dr Despina Hidden.     All questions were answered.  Past Medical History:  Diagnosis Date   Complication of anesthesia    nausea    Headache(784.0)    when dehydrated   Induction of Labor 03/27/2014   No pertinent past medical history    PP care - s/p VBAC 02/09/2012    Past Surgical History:  Procedure Laterality Date   APPENDECTOMY     CESAREAN SECTION     DENTAL SURGERY     FOOT SURGERY     MANDIBLE SURGERY      OB History     Gravida  4   Para  3   Term  3   Preterm  0   AB  1   Living  2      SAB  0   IAB  0   Ectopic  0   Multiple  0   Live Births  3           Allergies  Allergen Reactions   Morphine And Related Hives and Itching   Penicillins Hives and  Itching    Social History   Socioeconomic History   Marital status: Married    Spouse name: Not on file   Number of children: Not on file   Years of education: Not on file   Highest education level: Not on file  Occupational History   Not on file  Tobacco Use   Smoking status: Never   Smokeless tobacco: Never  Vaping Use   Vaping Use: Never used  Substance and Sexual Activity   Alcohol use: No   Drug use: No   Sexual activity: Yes    Birth control/protection: None  Other Topics Concern   Not on file  Social History Narrative   Not on file   Social Determinants of Health   Financial Resource Strain: Low Risk  (03/25/2023)   Overall Financial Resource Strain (CARDIA)    Difficulty of Paying Living Expenses: Not hard at all  Food Insecurity: No Food  Insecurity (03/25/2023)   Hunger Vital Sign    Worried About Running Out of Food in the Last Year: Never true    Ran Out of Food in the Last Year: Never true  Transportation Needs: No Transportation Needs (03/25/2023)   PRAPARE - Administrator, Civil Service (Medical): No    Lack of Transportation (Non-Medical): No  Physical Activity: Sufficiently Active (03/25/2023)   Exercise Vital Sign    Days of Exercise per Week: 5 days    Minutes of Exercise per Session: 30 min  Stress: Stress Concern Present (03/25/2023)   [employer] of Occupational Health - Occupational Stress Questionnaire    Feeling of Stress : Rather much  Social Connections: Socially Integrated (03/25/2023)   Social Connection and Isolation Panel [NHANES]    Frequency of Communication with Friends and Family: Twice a week    Frequency of Social Gatherings with Friends and Family: Twice a week    Attends Religious Services: More than 4 times per year    Active Member of [employer] or Organizations: Yes    Attends Engineer, structural: More than 4 times per year    Marital Status: Married    History reviewed. No pertinent family  history.

## 2023-04-08 ENCOUNTER — Ambulatory Visit: Payer: 59 | Admitting: Obstetrics & Gynecology

## 2023-04-08 ENCOUNTER — Encounter: Payer: Self-pay | Admitting: Obstetrics & Gynecology

## 2023-04-08 VITALS — BP 119/85 | HR 81 | Ht 63.0 in | Wt 163.0 lb

## 2023-04-08 DIAGNOSIS — Z3A00 Weeks of gestation of pregnancy not specified: Secondary | ICD-10-CM | POA: Diagnosis not present

## 2023-04-08 DIAGNOSIS — O039 Complete or unspecified spontaneous abortion without complication: Secondary | ICD-10-CM

## 2023-04-08 NOTE — Progress Notes (Signed)
Follow up appointment for results:   Chief Complaint  Patient presents with   Follow-up    Blood pressure 119/85, pulse 81, height 5\' 3"  (1.6 m), weight 163 lb (73.9 kg), last menstrual period 12/27/2022, not currently breastfeeding.  See 03/25/23 note  Patient took the cytotec and had cramping/bleeding/passage of tissue which then resolved once she experienced passage of tissue, and since then no bleeding or cramps  POC sonogram today reveals thin endometrium with no focal blood flow noted  Impression is completion of the incomplete Ab from the last visit  MEDS ordered this encounter: No orders of the defined types were placed in this encounter.   Orders for this encounter: No orders of the defined types were placed in this encounter.   Impression + Management Plan   ICD-10-CM   1. Completed  abortion S/P cytotec course  O03.9    sonogram today confirms complete passage of decidualized endometrium and the collapsed gestational sac      Follow Up: Return if symptoms worsen or fail to improve, for pt wants to make appt for yearly + Pap.     All questions were answered.  Past Medical History:  Diagnosis Date   Complication of anesthesia    nausea    Headache(784.0)    when dehydrated   Induction of Labor 03/27/2014   No pertinent past medical history    PP care - s/p VBAC 02/09/2012    Past Surgical History:  Procedure Laterality Date   APPENDECTOMY     CESAREAN SECTION     DENTAL SURGERY     FOOT SURGERY     MANDIBLE SURGERY      OB History     Gravida  4   Para  3   Term  3   Preterm  0   AB  1   Living  2      SAB  0   IAB  0   Ectopic  0   Multiple  0   Live Births  3           Allergies  Allergen Reactions   Morphine And Related Hives and Itching   Penicillins Hives and Itching    Social History   Socioeconomic History   Marital status: Married    Spouse name: Not on file   Number of children: Not on file   Years  of education: Not on file   Highest education level: Not on file  Occupational History   Not on file  Tobacco Use   Smoking status: Never   Smokeless tobacco: Never  Vaping Use   Vaping Use: Never used  Substance and Sexual Activity   Alcohol use: No   Drug use: No   Sexual activity: Yes    Birth control/protection: None  Other Topics Concern   Not on file  Social History Narrative   Not on file   Social Determinants of Health   Financial Resource Strain: Low Risk  (03/25/2023)   Overall Financial Resource Strain (CARDIA)    Difficulty of Paying Living Expenses: Not hard at all  Food Insecurity: No Food Insecurity (03/25/2023)   Hunger Vital Sign    Worried About Running Out of Food in the Last Year: Never true    Ran Out of Food in the Last Year: Never true  Transportation Needs: No Transportation Needs (03/25/2023)   PRAPARE - Administrator, Civil Service (Medical): No    Lack of Transportation (Non-Medical):  No  Physical Activity: Sufficiently Active (03/25/2023)   Exercise Vital Sign    Days of Exercise per Week: 5 days    Minutes of Exercise per Session: 30 min  Stress: Stress Concern Present (03/25/2023)   [employer] of Occupational Health - Occupational Stress Questionnaire    Feeling of Stress : Rather much  Social Connections: Socially Integrated (03/25/2023)   Social Connection and Isolation Panel [NHANES]    Frequency of Communication with Friends and Family: Twice a week    Frequency of Social Gatherings with Friends and Family: Twice a week    Attends Religious Services: More than 4 times per year    Active Member of [employer] or Organizations: Yes    Attends Engineer, structural: More than 4 times per year    Marital Status: Married    History reviewed. No pertinent family history.

## 2023-04-08 NOTE — Progress Notes (Deleted)
   NURSE VISIT- BLOOD PRESSURE CHECK  SUBJECTIVE:  Holly Boyd is a 37 y.o. [phone] female here for BP check. She is postpartum, delivery date 04/01/2023     HYPERTENSION ROS:  Pregnant/postpartum:  Severe headaches that don't go away with tylenol/other medicines: No  Visual changes (seeing spots/double/blurred vision) No  Severe pain under right breast breast or in center of upper chest No  Severe nausea/vomiting No  Taking medicines as instructed yes    OBJECTIVE:  LMP 12/27/2022   Appearance alert, well appearing, and in no distress.  ASSESSMENT: Postpartum  blood pressure check  PLAN: Discussed with Dr. Despina Hidden   Recommendations: no changes needed   Follow-up: as scheduled   Annamarie Dawley  04/08/2023 11:16 AM

## 2023-04-14 ENCOUNTER — Encounter: Payer: 59 | Admitting: Adult Health

## 2023-05-14 ENCOUNTER — Encounter: Payer: Self-pay | Admitting: Obstetrics & Gynecology

## 2023-05-14 ENCOUNTER — Ambulatory Visit (INDEPENDENT_AMBULATORY_CARE_PROVIDER_SITE_OTHER): Payer: 59 | Admitting: Obstetrics & Gynecology

## 2023-05-14 ENCOUNTER — Other Ambulatory Visit (HOSPITAL_COMMUNITY)
Admission: RE | Admit: 2023-05-14 | Discharge: 2023-05-14 | Disposition: A | Discharge status: Home / Self Care | Payer: 59 | Source: Ambulatory Visit | Attending: Obstetrics & Gynecology | Admitting: Obstetrics & Gynecology

## 2023-05-14 VITALS — BP 111/78 | HR 90 | Ht 63.0 in | Wt 158.0 lb

## 2023-05-14 DIAGNOSIS — Z01419 Encounter for gynecological examination (general) (routine) without abnormal findings: Secondary | ICD-10-CM | POA: Insufficient documentation

## 2023-05-14 MED ORDER — SLYND 4 MG PO TABS: 1.0000 | ORAL_TABLET | Freq: Every day | ORAL | 12 refills | Status: DC

## 2023-05-14 NOTE — Progress Notes (Signed)
Subjective:     Holly Boyd is a 37 y.o. female here for a routine exam.  Patient's last menstrual period was 05/09/2023. [id number] Birth Control Method:  none Menstrual Calendar(currently): regular  Current complaints: has migraine with aura on COC--wants to try POP.   Current acute medical issues:  none   Recent Gynecologic History Patient's last menstrual period was 05/09/2023. Last Pap: unsure,  normal Last mammogram: na,    Past Medical History:  Diagnosis Date   Complication of anesthesia    nausea    Headache(784.0)    when dehydrated   Induction of Labor 03/27/2014   No pertinent past medical history    PP care - s/p VBAC 02/09/2012    Past Surgical History:  Procedure Laterality Date   APPENDECTOMY     CESAREAN SECTION     DENTAL SURGERY     FOOT SURGERY     MANDIBLE SURGERY      OB History     Gravida  4   Para  3   Term  3   Preterm  0   AB  1   Living  2      SAB  0   IAB  0   Ectopic  0   Multiple  0   Live Births  3           Social History   Socioeconomic History   Marital status: Married    Spouse name: Not on file   Number of children: Not on file   Years of education: Not on file   Highest education level: Not on file  Occupational History   Not on file  Tobacco Use   Smoking status: Never   Smokeless tobacco: Never  Vaping Use   Vaping Use: Never used  Substance and Sexual Activity   Alcohol use: No   Drug use: No   Sexual activity: Yes    Birth control/protection: None  Other Topics Concern   Not on file  Social History Narrative   Not on file   Social Determinants of Health   Financial Resource Strain: Low Risk  (05/14/2023)   Overall Financial Resource Strain (CARDIA)    Difficulty of Paying Living Expenses: Not hard at all  Food Insecurity: No Food Insecurity (05/14/2023)   Hunger Vital Sign    Worried About Running Out of Food in the Last Year: Never true    Ran Out of Food in the Last Year: Never  true  Transportation Needs: No Transportation Needs (05/14/2023)   PRAPARE - Administrator, Civil Service (Medical): No    Lack of Transportation (Non-Medical): No  Physical Activity: Sufficiently Active (05/14/2023)   Exercise Vital Sign    Days of Exercise per Week: 5 days    Minutes of Exercise per Session: 30 min  Stress: No Stress Concern Present (05/14/2023)   [employer] of Occupational Health - Occupational Stress Questionnaire    Feeling of Stress : Only a little  Recent Concern: Stress - Stress Concern Present (03/25/2023)   [employer] of Occupational Health - Occupational Stress Questionnaire    Feeling of Stress : Rather much  Social Connections: Socially Integrated (05/14/2023)   Social Connection and Isolation Panel [NHANES]    Frequency of Communication with Friends and Family: More than three times a week    Frequency of Social Gatherings with Friends and Family: Twice a week    Attends Religious Services: More than 4 times per year  Active Member of Clubs or Organizations: Yes    Attends Banker Meetings: More than 4 times per year    Marital Status: Married    History reviewed. No pertinent family history.   Current Outpatient Medications:    Drospirenone (SLYND) 4 MG TABS, Take 1 tablet (4 mg total) by mouth daily., Disp: 28 tablet, Rfl: 12   fexofenadine (ALLEGRA) 180 MG tablet, Take 180 mg by mouth daily., Disp: , Rfl:    phentermine (ADIPEX-P) 37.5 MG tablet, Take 37.5 mg by mouth daily before breakfast., Disp: , Rfl:    Prenatal Vit-Min-FA-Fish Oil ([employer] PRENATAL GUMMY PO), Take 2 tablets by mouth daily., Disp: , Rfl:    ranitidine (ZANTAC) 150 MG tablet, Take 150 mg by mouth 2 (two) times daily as needed. For heartburn, Disp: , Rfl:    Semaglutide-Weight Management (WEGOVY) 0.5 MG/0.5ML SOAJ, Inject 1 pen (0.5 mg)  into the skin once a week., Disp: 2 mL, Rfl: 0  Review of Systems  Review of Systems  Constitutional:  Negative for fever, chills, weight loss, malaise/fatigue and diaphoresis.  HENT: Negative for hearing loss, ear pain, nosebleeds, congestion, sore throat, neck pain, tinnitus and ear discharge.   Eyes: Negative for blurred vision, double vision, photophobia, pain, discharge and redness.  Respiratory: Negative for cough, hemoptysis, sputum production, shortness of breath, wheezing and stridor.   Cardiovascular: Negative for chest pain, palpitations, orthopnea, claudication, leg swelling and PND.  Gastrointestinal: negative for abdominal pain. Negative for heartburn, nausea, vomiting, diarrhea, constipation, blood in stool and melena.  Genitourinary: Negative for dysuria, urgency, frequency, hematuria and flank pain.  Musculoskeletal: Negative for myalgias, back pain, joint pain and falls.  Skin: Negative for itching and rash.  Neurological: Negative for dizziness, tingling, tremors, sensory change, speech change, focal weakness, seizures, loss of consciousness, weakness and headaches.  Endo/Heme/Allergies: Negative for environmental allergies and polydipsia. Does not bruise/bleed easily.  Psychiatric/Behavioral: Negative for depression, suicidal ideas, hallucinations, memory loss and substance abuse. The patient is not nervous/anxious and does not have insomnia.        Objective:  Blood pressure 111/78, pulse 90, height 5\' 3"  (1.6 m), weight 158 lb (71.7 kg), last menstrual period 05/09/2023, not currently breastfeeding.   Physical Exam  Vitals reviewed. Constitutional: She is oriented to person, place, and time. She appears well-developed and well-nourished.  HENT:  Head: Normocephalic and atraumatic.        Right Ear: External ear normal.  Left Ear: External ear normal.  Nose: Nose normal.  Mouth/Throat: Oropharynx is clear and moist.  Eyes: Conjunctivae and EOM are normal. Pupils are equal, round, and reactive to light. Right eye exhibits no discharge. Left eye exhibits no discharge. No  scleral icterus.  Neck: Normal range of motion. Neck supple. No tracheal deviation present. No thyromegaly present.  Cardiovascular: Normal rate, regular rhythm, normal heart sounds and intact distal pulses.  Exam reveals no gallop and no friction rub.   No murmur heard. Respiratory: Effort normal and breath sounds normal. No respiratory distress. She has no wheezes. She has no rales. She exhibits no tenderness.  GI: Soft. Bowel sounds are normal. She exhibits no distension and no mass. There is no tenderness. There is no rebound and no guarding.  Genitourinary:  Breasts no masses skin changes or nipple changes bilaterally      Vulva is normal without lesions Vagina is pink moist without discharge Cervix normal in appearance and pap is done Uterus is normal size shape and contour Adnexa is  negative with normal sized ovaries   Musculoskeletal: Normal range of motion. She exhibits no edema and no tenderness.  Neurological: She is alert and oriented to person, place, and time. She has normal reflexes. She displays normal reflexes. No cranial nerve deficit. She exhibits normal muscle tone. Coordination normal.  Skin: Skin is warm and dry. No rash noted. No erythema. No pallor.  Psychiatric: She has a normal mood and affect. Her behavior is normal. Judgment and thought content normal.       Medications Ordered at today's visit: Meds ordered this encounter  Medications   Drospirenone (SLYND) 4 MG TABS    Sig: Take 1 tablet (4 mg total) by mouth daily.    Dispense:  28 tablet    Refill:  12    Other orders placed at today's visit: No orders of the defined types were placed in this encounter.     Assessment:    Normal Gyn exam.   Migraine with aura-->will try POP Recent miscarriage Plan:    Contraception: start slynd. Follow up in: 3 years.     Return in about 3 years (around 05/13/2026) for yearly.

## 2023-05-17 LAB — CYTOLOGY - PAP
Adequacy: ABSENT
Comment: NEGATIVE
Diagnosis: NEGATIVE
High risk HPV: NEGATIVE

## 2023-05-18 ENCOUNTER — Other Ambulatory Visit: Payer: Self-pay | Admitting: *Deleted

## 2023-05-18 MED ORDER — SLYND 4 MG PO TABS: 1.0000 | ORAL_TABLET | Freq: Every day | ORAL | 12 refills | Status: AC
